# Patient Record
Sex: Male | Born: 1935 | Race: White | Hispanic: No | Marital: Married | Smoking: Former smoker
Health system: Southern US, Community
[De-identification: ages and names within clinical notes are randomized; demographics above are authoritative.]

## PROBLEM LIST (undated history)

## (undated) DIAGNOSIS — M1990 Unspecified osteoarthritis, unspecified site: Secondary | ICD-10-CM

## (undated) DIAGNOSIS — C801 Malignant (primary) neoplasm, unspecified: Secondary | ICD-10-CM

## (undated) DIAGNOSIS — I219 Acute myocardial infarction, unspecified: Secondary | ICD-10-CM

## (undated) DIAGNOSIS — K219 Gastro-esophageal reflux disease without esophagitis: Secondary | ICD-10-CM

## (undated) DIAGNOSIS — I10 Essential (primary) hypertension: Secondary | ICD-10-CM

## (undated) DIAGNOSIS — G709 Myoneural disorder, unspecified: Secondary | ICD-10-CM

## (undated) DIAGNOSIS — E785 Hyperlipidemia, unspecified: Secondary | ICD-10-CM

## (undated) HISTORY — PX: PROSTATE SURGERY: SHX751

## (undated) HISTORY — PX: JOINT REPLACEMENT: SHX530

## (undated) HISTORY — PX: ROTATOR CUFF REPAIR: SHX139

## (undated) HISTORY — PX: BACK SURGERY: SHX140

## (undated) HISTORY — PX: CERVICAL LAMINECTOMY: SHX94

## (undated) HISTORY — PX: SKIN CANCER EXCISION: SHX779

---

## 1998-11-04 ENCOUNTER — Ambulatory Visit (HOSPITAL_COMMUNITY): Admission: RE | Admit: 1998-11-04 | Discharge: 1998-11-04 | Payer: Self-pay | Admitting: Family Medicine

## 1998-11-04 ENCOUNTER — Encounter: Payer: Self-pay | Admitting: Family Medicine

## 1998-12-27 ENCOUNTER — Encounter: Payer: Self-pay | Admitting: Neurological Surgery

## 1998-12-27 ENCOUNTER — Ambulatory Visit (HOSPITAL_COMMUNITY): Admission: RE | Admit: 1998-12-27 | Discharge: 1998-12-27 | Payer: Self-pay | Admitting: Neurological Surgery

## 1999-01-06 ENCOUNTER — Encounter: Payer: Self-pay | Admitting: Neurological Surgery

## 1999-01-10 ENCOUNTER — Encounter: Payer: Self-pay | Admitting: Neurological Surgery

## 1999-01-10 ENCOUNTER — Inpatient Hospital Stay (HOSPITAL_COMMUNITY): Admission: RE | Admit: 1999-01-10 | Discharge: 1999-01-11 | Payer: Self-pay | Admitting: Neurological Surgery

## 2001-02-11 ENCOUNTER — Inpatient Hospital Stay (HOSPITAL_COMMUNITY): Admission: EM | Admit: 2001-02-11 | Discharge: 2001-02-13 | Payer: Self-pay | Admitting: Emergency Medicine

## 2001-02-11 ENCOUNTER — Encounter: Payer: Self-pay | Admitting: Emergency Medicine

## 2001-02-12 ENCOUNTER — Encounter: Payer: Self-pay | Admitting: Cardiovascular Disease

## 2001-08-07 ENCOUNTER — Encounter: Payer: Self-pay | Admitting: Neurological Surgery

## 2001-08-07 ENCOUNTER — Ambulatory Visit (HOSPITAL_COMMUNITY): Admission: RE | Admit: 2001-08-07 | Discharge: 2001-08-07 | Payer: Self-pay | Admitting: Neurological Surgery

## 2003-09-12 ENCOUNTER — Ambulatory Visit (HOSPITAL_COMMUNITY): Admission: RE | Admit: 2003-09-12 | Discharge: 2003-09-12 | Payer: Self-pay | Admitting: Family Medicine

## 2003-09-12 ENCOUNTER — Encounter: Payer: Self-pay | Admitting: Family Medicine

## 2003-11-22 ENCOUNTER — Ambulatory Visit (HOSPITAL_COMMUNITY): Admission: RE | Admit: 2003-11-22 | Discharge: 2003-11-22 | Payer: Self-pay | Admitting: Neurological Surgery

## 2006-02-27 ENCOUNTER — Inpatient Hospital Stay (HOSPITAL_COMMUNITY): Admission: RE | Admit: 2006-02-27 | Discharge: 2006-03-05 | Payer: Self-pay | Admitting: Orthopaedic Surgery

## 2006-06-07 ENCOUNTER — Encounter: Admission: RE | Admit: 2006-06-07 | Discharge: 2006-06-07 | Payer: Self-pay | Admitting: Family Medicine

## 2006-06-14 ENCOUNTER — Encounter: Admission: RE | Admit: 2006-06-14 | Discharge: 2006-06-14 | Payer: Self-pay | Admitting: General Surgery

## 2006-07-01 ENCOUNTER — Ambulatory Visit (HOSPITAL_COMMUNITY): Admission: RE | Admit: 2006-07-01 | Discharge: 2006-07-02 | Payer: Self-pay | Admitting: General Surgery

## 2006-07-01 ENCOUNTER — Encounter (INDEPENDENT_AMBULATORY_CARE_PROVIDER_SITE_OTHER): Payer: Self-pay | Admitting: *Deleted

## 2008-09-22 ENCOUNTER — Encounter: Admission: RE | Admit: 2008-09-22 | Discharge: 2008-09-22 | Payer: Self-pay | Admitting: Orthopaedic Surgery

## 2008-10-01 ENCOUNTER — Inpatient Hospital Stay (HOSPITAL_COMMUNITY): Admission: RE | Admit: 2008-10-01 | Discharge: 2008-10-02 | Payer: Self-pay | Admitting: Orthopaedic Surgery

## 2009-03-24 ENCOUNTER — Encounter: Admission: RE | Admit: 2009-03-24 | Discharge: 2009-03-24 | Payer: Self-pay | Admitting: Orthopaedic Surgery

## 2011-04-10 NOTE — Op Note (Signed)
NAMEFREEMON, BINFORD NO.:  [id number]   MEDICAL RECORD NO.:  [id number]          PATIENT TYPE:  INP   LOCATION:  5019                         FACILITY:  [facility]   PHYSICIAN:  Mark C. Ophelia Charter, M.D.    DATE OF BIRTH:  Apr 09, 1934   DATE OF PROCEDURE:  10/01/2008  DATE OF DISCHARGE:                               OPERATIVE REPORT   PREOPERATIVE DIAGNOSIS:  L5-S1 spinal stenosis.   POSTOPERATIVE DIAGNOSIS:  L5-S1 spinal stenosis.   PROCEDURE:  Left L5-S1 microdiskectomy and foraminotomy.   SURGEON:  Mark C. Ophelia Charter, MD   ANESTHESIA:  GOT.  Marcaine 7 mL local.   ESTIMATED BLOOD LOSS:  Minimal.   FINDINGS:  HNP, extruded left L5-S1 with compression.   After induction of general anesthesia, the patient was placed prone on  chest rolls.  He had total-knee arthroplasty in the past and back was  prepped with DuraPrep.  Preoperative antibiotics were given, Ancef.  The  area squared with towels, Betadine bio drape applied.  Needle was placed  for localization of the L5-S1 interspace.  Cross-table lateral x-ray was  used to confirm it.  While this was developing, the time-out checklist  procedure was completed.   An incision was made and midline, subperiosteal dissection was performed  on the left side.  Foraminotomy was performed with laminotomy.  There  was overhanging spurs, which returned back and there was disk herniation  on the left at L5-S1 with extruded fragment that was outside the  interspace and was lateral and superior corresponding to the MRI scan.  This was causing a compression, and chunks of the disks were teased and  removed.  A periodic stick was passed around and some remaining  fragments were found.  Epstein's were used in the midline and diskectomy  was performed.  Foramina was enlarged, nerve root was freed.  After  irrigation of saline solution with findings of disk herniation,  consistent with the patient's leg symptoms that he had been having,  the  opposite foramina was not exposed.  Disk fragment was moderately large.  After irrigation with saline solution with the patient's new onset of  left leg symptoms was felt that the opposite foramina was minimally  type, but the indications for surgery had been sudden onset of  significant left leg pain with disk herniation present for 5 level on  the opposite foramina on the right side were left.  Hockey stick was  passed into the dura with no areas of compression.  Layered  closure with 0 Vicryl and the deep fascia with 2-0, and the subcutaneous  tissue with 4-0 Vicryl subcuticular closure.  Instrument count and  needle count was correct.  After Steri-Strips, and Marcaine  infiltration, postop dressing, the patient was placed supine, extubated  and transferred to recovery room in stable condition.      Mark C. Ophelia Charter, M.D.  Electronically Signed     MCY/MEDQ  D:  10/01/2008  T:  10/01/2008  Job:  [id number]

## 2011-04-13 NOTE — Discharge Summary (Signed)
[name], [name] NO.:  [id number]   MEDICAL RECORD NO.:  [id number]          PATIENT TYPE:  INP   LOCATION:  5004                         FACILITY:  [facility]   PHYSICIAN:  [name], M.D.    DATE OF BIRTH:  1934/02/27   DATE OF ADMISSION:  02/27/2006  DATE OF DISCHARGE:  03/05/2006                                 DISCHARGE SUMMARY   FINAL DIAGNOSIS:  Right knee osteoarthritis, right total knee arthroplasty  performed.   ADDITIONAL DIAGNOSES:  1.  Bladder neck obstruction.  2.  Postoperative urinary retention.  3.  Hypertension.  4.  Diabetes type 2.  5.  Old myocardial infarction.  6.  Esophageal reflux.  7.  Right bundle branch block.  8.  Coronary artery disease.   ADMISSION MEDICATIONS:  Accupril, potassium, Avandia, Lasix, Glucotrol,  oxycodone, diltiazem, Protonix, Neurontin, clonazepam, aspirin and Xanax.  See H&P for dosages.   This is a 77 year old male who has progressive right knee osteoarthritis,  failed conservative treatment, tricompartmental degenerative changes with  evidence of meniscal tearing and bone-on-bone changes on x-ray.  Preoperative labs included normal CBC with hemoglobin of 13.9.  PT/PTT were  normal.  Chemistry panel was normal.  Urinalysis was normal.   PROCEDURE:  After induction of general anesthesia, the patient was planned  to have a total knee arthroplasty, however Foley catheter was unable to be  inserted.  Urology consultation was obtained on an emergent basis with Dr.  Aldean [name], and he recommended proceeding, and he would insert the Foley  postoperatively, prior to patient being extubated and transferred to the  recovery room.  Right cemented total knee arthroplasty was performed.  Tourniquet time was 1 hour and 2 minutes.  Foley was inserted by Dr.  Aldean [name] and was recommended to be left in and coude' tip 16-Foley was left  in.  Office follow up with Dr. Aldean [name] was made.  Hemoglobin was 11.5  postop.   Glucose was 147.  Pain was well controlled.  He was slow with  physical therapy.  He was converted from morphine pump to oral pain  medication.  He could not straight leg raise by March 02, 2006, postop day  three, continued CMP and diltiazem was adjusted for his blood pressure.  He  continued his therapy over the weekend, and though he was felt to be an  ideal candidate for SACU, this bed was not available.  He continued therapy  until he was ambulating safely enough to go home.  Nursing home was  discussed and considered, but with gradual progression he was finally safely  ambulatory and was discharged home in satisfactory condition.   ASSESSMENT:  1.  Bladder outlet obstruction with intraoperative Foley insertion by Dr.      Vic [name].  2.  Knee osteoarthritis status post total knee arthroplasty.   FOLLOW UP:  1.  Dr. Aldean [name] in 2-3 weeks.  2.  Dr. Ophelia [name] in 2 weeks.   CONDITION AT DISCHARGE:  Improved.      [name], M.D.  Electronically Signed     MCY/MEDQ  D:  03/31/2006  T:  04/01/2006  Job:  [id number]

## 2011-04-13 NOTE — Discharge Summary (Signed)
[place]  Patient:    [name], [name]                    MRN: [id number] Adm. Date:  [id number] Disc. Date: [id number] Attending:  Ruta Hinds CC:         Talmadge Coventry, M.D.   Discharge Summary  ADMISSION DIAGNOSES: 1. Unstable angina, rule out myocardial infarction. 2. Hypertension. 3. Adult-onset diabetes mellitus. 4. Exogenous obesity.  MEDICATIONS: 1. Avandia 4 mg q.d. 2. Furosemide 40 mg b.i.d. 3. Neurontin 300 mg q.d. 4. Glucotrol XL 10 mg q.d. 5. Diltiazem 240 mg q.d. 6. Accuretic 20/12 mg q.d.  HISTORY OF PRESENT ILLNESS:  [name] is a 77 year old Caucasian male, father of four and grandfather of six children, reported to the emergency room with severe chest pressure and tightness.  He reported waking up at 3 oclock at night with severe substernal chest pressure.  He did not take any nitroglycerin because the old refill expired.  He said he had a similar episode of pain experienced two weeks ago and, at that time, he had to lie down and rest, and the pain went away; but this time the pain was much worse and lasted longer.  PAST MEDICAL HISTORY: 1. Transurethral resection of the prostate secondary to benign prostatic    hypertrophy. 2. Anterior cervical diskectomy and arthrodesis at C5-6 and C6-7. 3. Hypertension. 4. Adult-onset diabetes mellitus. 5. History of cardiac catheterization performed seven or eight years ago by    Dr. Elsie Lincoln and, at that time, the study was negative.  FAMILY HISTORY:  Positive for cardiovascular disease.  Mother had hypertension and a massive heart attack and stroke.  His sister also has a history of coronary vascular disease.  ALLERGIES:  No known drug allergies.  HOSPITAL COURSE:  The patient was admitted to the telemetry unit and started on heparin drip and nitroglycerin drip, and he reported that the chest pain and pressure became better.  By a few hours later  he reported increased chest pain and was given a dose of morphine sulfate, which relieved the pain and eased the breathing.  Laboratories on admission were white blood count 7.5, hemoglobin 13.3, hematocrit 39, platelets 215.  EKG showed normal sinus rhythm with ST changes in inferior leads.  CK was 313, CK-MB 5, troponin 0.02.  The patient underwent coronary angiography performed by Dr. Tresa Endo.  It showed mild coronary artery disease, 10% narrowing of right carotid artery, and 20% narrowing of LAD.  It did not require any intervention.  The patient tolerated the procedure well.  There was no bruising and bleeding.  DISCHARGE DIAGNOSES: 1. Mild coronary artery disease, status post cardiac catheterization on February 12, 2001.  Requires medical treatment and outpatient follow-up. 2. Hypertension, which requires better control, and the patients    medications were changed.  Instead of Accuretic, he was started on    Accupril 20 mg q.d. 3. Adult-onset diabetes mellitus, in control. 4. Obesity.  DISCHARGE PHYSICAL EXAMINATION:  VITAL SIGNS:  Temperature 97.7, blood pressure 154/75, pulse 82, respirations 20.  HEART:  Regular rate and rhythm.  LUNGS:  Clear to auscultation bilaterally.  ABDOMEN:  Obese.  Bowel sounds present x 4.  Soft, nontender, nondistended.  EXTREMITIES:  With 1+ pedal pulses and 1+ edema.  Right groin did not show any signs of bleeding or bruising.  LABORATORY DATA:  His lipid fasting profile was normal at the time of discharge  with cholesterol 97, triglycerides 59, HDL 37, and LDL 46.  BMET at the time of discharge showed sodium 138 and low potassium at 3.2, bicarbonate 29, chloride 104, BUN 9, creatinine 0.7, with glucose 118.  Hypokalemia was treated with potassium supplements.  The patient was given K-Dur 20 mEq and was discharged home with prescription for potassium also.  DISCHARGE MEDICATIONS: 1. K-Dur 20 mEq 1 pill daily. 2. Accupril 20 mg 1 pill  daily. 3. Protonix 40 mg 1 pill daily. 4. Avandia 4 mg 1 pill daily. 5. Glucotrol XL 10 mg 1 pill daily. 6. Diltiazem 240 mg 1 pill daily. 7. Lasix 40 mg 1 pill twice a day.  He was given prescriptions for new medications of Accupril, Protonix, and K-Dur.  DISCHARGE INSTRUCTIONS:  He was instructed to report to the laboratory to check BMP on Monday to make sure that his hypokalemia is treated.  FOLLOW-UP:  Follow-up appointment was scheduled with Dr. Elsie Lincoln ______ to follow up at 1:20 p.m. DD:  02/13/01 TD:  02/14/01 Job: [id number]

## 2011-04-13 NOTE — Op Note (Signed)
NAMECRAY, MONNIN NO.:  [id number]   MEDICAL RECORD NO.:  [id number]          PATIENT TYPE:  INP   LOCATION:  2899                         FACILITY:  [facility]   PHYSICIAN:  Mark C. Ophelia Charter, M.D.    DATE OF BIRTH:  26-Apr-1934   DATE OF PROCEDURE:  DATE OF DISCHARGE:  02/27/2006                                 OPERATIVE REPORT   PREOPERATIVE DIAGNOSIS:  Right knee osteoarthritis.   POSTOPERATIVE DIAGNOSIS:  Right knee osteoarthritis.   OPERATION/PROCEDURE:  Right total knee arthroplasty.   SURGEON:  Mark C. Ophelia Charter, M.D.   ANESTHESIA:  GOT plus Marcaine local.   TOURNIQUET TIME:  One hour 10 minutes.   DESCRIPTION OF PROCEDURE:  After induction of general anesthesia orotracheal  intubation, attempted Foley placement by the nurse and also myself was  unsuccessful.  Urology service was contacted, Dr. Aldean Ast, and he  recommended we proceed with the surgery.  He would come in and insert the  Foley at the end of the case.  The standard prep and drape was performed,  sterile skin marker,__________  stockinette, Coban, and Vi-Drape.  The leg  was wrapped in Esmarch, tourniquet inflated.  A midline incision was made.  Superficial retinaculum was developed.  Medial parapatellar skin incision  was used tobring the __________ between the medial one-third and lateral two-  thirds.  Patella was flipped over.  Patella was cut first followed by taking  10 mm off the distal femur.  Next, intramedullary cut was made on the tibia  removing the 8-10 mm of bone.  Sequential chamfer cuts for #4 size on the  femur, #4 on the tibia were performed and a 38 mm patella.  Box cuts were  made. Posterior osteophytes were removed off the femur.  Cruciate PCL  remnant was removed with trials and the knee would come up to full  extension, flexes easily.  There was good symmetrical balance medial and  lateral.  After pulsatile irrigation, cement was mixed in the vacuum,  implanted tibia  first followed by the femur, then patella.  Tibial insert  was popped in which was a 10 mm based on trial sizes.  All excessive cement  had been trimmed away.  Cement was hard at 17 minutes.  Tourniquet was  deflated after an hour and 10 minutes.  Hemostasis was obtained.  Deep layer  closed with 0 Ethibond and 0 Ticron, 2-0 Vicryl in the superficial  retinaculum, subcutaneous tissue and skin staple closure.  Marcaine  infiltration, 10 mL, followed by urologic service with the Foley catheter  placement.  Afterwards depending Foley placement, the patient may have a  femoral block by the anesthesia service for postoperative analgesia.      Mark C. Ophelia Charter, M.D.  Electronically Signed     MCY/MEDQ  D:  02/27/2006  T:  02/28/2006  Job:  [id number]

## 2011-04-13 NOTE — Op Note (Signed)
NAMEMARSHUN, DUVA NO.:  [id number]   MEDICAL RECORD NO.:  [id number]          PATIENT TYPE:  INP   LOCATION:  2899                         FACILITY:  [facility]   PHYSICIAN:  Courtney Paris, M.D.DATE OF BIRTH:  05/21/1934   DATE OF PROCEDURE:  02/27/2006  DATE OF DISCHARGE:                                 OPERATIVE REPORT   CONSULTATION AND OPERATIVE NOTE:  Consult requested by Veverly Fells. Ophelia Charter, M.D.   PREOPERATIVE DIAGNOSIS:  Unable to pass Foley catheter.   POSTOPERATIVE DIAGNOSIS:  Bladder neck contracture.   PROCEDURE:  Cystoscopy, bladder neck dilation, insertion of Foley catheter.   BRIEF HISTORY:  The patient is a 77 year old patient who was undergoing a  right knee arthroplasty by Dr. Ophelia Charter but preoperatively after he was asleep  was unable to insert Foley catheter.  Urologic consultation was then  obtained.  The patient was asleep so I was unable to obtain history, but  apparently in the notes he has had some prostate problems in the past.  I do  not know if he has had previous surgery.   The right knee had already been done by Dr. Ophelia Charter, was wrapped and dressed  and covered with a waterproof sheet.  The patient was then prepped and  draped and using the flexible cystoscope, his anterior urethra was  inspected.  No anterior strictures were seen.  The posterior urethra showed  what looked like a TUR effect with a very small opening into the bladder  neck.  This was probably 2 or 3 [place] in size.  Through the scope I was  able to pass a guidewire under direct vision through the scope, then tried  to pass a #16 Council-tip catheter, but did not seem to want to go.  Leaving  the wire in place, it was again checked to make sure that it was still in  position and a #20 and 24 Council-tip ureteral sound was then passed over  the guidewire into the bladder without difficulty.  Following this I was  able to get the #16 Foley catheter into the bladder, which  was then drained  with clear urine.  This was left to straight drainage.   The plan is to leave the catheter probably for at least two to three weeks  to soft catheter dilate the bladder neck contracture.  He may need a more  formal incision of the bladder neck at a later time but due to his recent  knee surgery, hopefully this will be put off until a later time.  I will  give his family one of my cards and I will have him come to my office in  three weeks for follow-up and management of the Foley, which will probably  be removed at that time.      Courtney Paris, M.D.  Electronically Signed     HMK/MEDQ  D:  02/27/2006  T:  02/28/2006  Job:  [id number]

## 2011-04-13 NOTE — Cardiovascular Report (Signed)
[place]  Patient:    [name], [name]                      MRN: [id number] Proc. Date: 02/12/01 Attending:  Lennette Bihari, M.D. CC:         Madaline Savage, M.D.  Talmadge Coventry, M.D.  Marnee Spring   Cardiac Catheterization  INDICATIONS:  [name] is a 77year-old gentleman, with a history of hypertension for many years, type 2 diabetes mellitus, and remote normal coronary arteries by catheterization in 1994.  The patient was admitted on February 11, 2001, with chest pain suggesting the possibility of unstable angina. CK was elevated at 313 with an MB of 5.0.  The patient is now referred for definitive diagnostic cardiac catheterization.  PROCEDURES:  Left heart catheterization; cine coronary angiography; biplane cine left ventriculography; distal aortography.  HEMODYNAMIC DATA:  Central aortic pressure 137/68.  Left ventricular pressure 137/21.  ANGIOGRAPHIC DATA:  Left main coronary artery:  The left main coronary artery was a very long vessel that was angiographically normal and trifurcated into an LAD, a ramus intermediate vessel and left circumflex coronary artery.  Left anterior descending:  The LAD extended to the apex and gave rise to several proximal septal perforating arteries and several diagonal vessels. There was smooth diffuse 20% narrowing between a first and second septal perforator in the region of the first diagonal takeoff.  The ramus intermediate vessel was angiographically normal.  Circumflex artery:  The circumflex gave rise to one additional marginal vessel and was angiographically normal.  Right coronary artery:  The right coronary artery was a very large dominant vessel that had mild 20% mid and 20% distal minimal irregularity.  The vessel supplied a PDA, ______ left ventricular branches and ended in a very large posterolateral vessel which extended to the apex.  Biplane cinearteriography  revealed normal LV function with an ejection fraction of 69%.  There was a suggestion of very mild angiographic mitral valve prolapse.  IMPRESSION: 1. Normal left ventricular function. 2. Mild angiographic mitral valve prolapse. 3. Smooth 20% diffuse proximal left anterior descending stenosis. 4. A 20% mid and 20% distal mild luminal irregularity in a large dominant    right coronary artery.  RECOMMENDATIONS:  Medical therapy. DD:  02/12/01 TD:  02/13/01 Job: [id number]

## 2011-04-13 NOTE — Op Note (Signed)
NAMEBENTLEIGH, STANKUS NO.:  [id number]   MEDICAL RECORD NO.:  [id number]          PATIENT TYPE:  OIB   LOCATION:  0098                         [facility]ACILITY[facility]  [facility]   PHYSICIAN:  Gita Kudo, M.D. DATE OF BIRTH:  [birth year]   DATE OF PROCEDURE:  07/01/2006  DATE OF DISCHARGE:                                 OPERATIVE REPORT   OPERATIVE PROCEDURE:  Laparoscopic cholecystectomy with intraoperative  cholangiogram.   SURGEON:  Gita Kudo, M.D.   ASSISTANT:  Alfonse Ras, MD   ANESTHESIA:  General.   PREOPERATIVE DIAGNOSIS:  Cholecystitis, stones.   POSTOPERATIVE DIAGNOSIS:  Cholecystitis, stones.   CLINICAL SUMMARY:  A 77 year old male brought in for elective  cholecystectomy because of abdominal pain and fatty food intolerance.   OPERATIVE FINDINGS:  The patient's gallbladder was distended, thin-walled.  It was intrahepatic.  I had leave the back wall of the most distal portion.   OPERATIVE PROCEDURE:  Under satisfactory general endotracheal anesthesia,  having received 1.0 grams Ancef preop, the patient was positioned, prepped  and draped in standard fashion.  A total of 30 mL of 0.5% Marcaine with  epinephrine was infiltrated at the skin incision sites for postop analgesia.  Transverse incision made above the umbilicus, midline opened into the  peritoneum and controlled with a figure-of-eight 0 Vicryl suture.  Then  camera placed after CO2 pneumoperitoneum established.  Under direct vision  two #5 ports were placed laterally and a second #10 medially.  Lateral  graspers gave good exposure and operating through the medial port with good  visualization, I dissected the cystic duct and artery.  A single clip placed  on the cystic duct near the gallbladder, incision made and percutaneous  catheter placed.  Good cholangiogram obtained without obstruction or defect.  Catheter withdrawn and the duct controlled with multiple clips and divided.  Likewise the artery was controlled with multiple clips and divided.  The  gallbladder then removed from below upward using coagulating current for  hemostasis and dissection.   At the most proximal portion of the gallbladder, it was quite adherent to  the liver and I felt best to enter it and leave the posterior wall which was  done.  After the gallbladder was removed, it was placed in an EndoCatch bag  and the entire liver bed cauterized.  There was a little bit of oozing and  this was controlled with FloSeal and Surgicel.  The gallbladder was then  removed through the umbilical port after the camera was moved proximally.  The abdomen was lavaged with saline and suctioned dry.  CO2 and ports  removed.   Midline closed with a previous figure-of-eight and a second 0 Vicryl suture.  Subcu approximated with 4-0 Vicryl and Steri-Strips for skin.  Sterile  absorbent dressings were then applied and the patient went to the recovery  room from the operating room in good condition without complication.           ______________________________  Gita Kudo, M.D.     MRL/MEDQ  D:  07/01/2006  T:  07/01/2006  Job:  [id number]  cc:   Loraine Leriche C. Ophelia Charter, M.D.  Fax: [phone]   Courtney Paris, M.D.  Fax: [phone]   Talmadge Coventry, M.D.  Fax: [phone]

## 2011-07-20 ENCOUNTER — Ambulatory Visit: Payer: Self-pay | Admitting: Otolaryngology

## 2011-08-02 ENCOUNTER — Ambulatory Visit: Payer: Self-pay | Admitting: Otolaryngology

## 2011-08-28 LAB — CBC
HCT: 43.5
Hemoglobin: 14.5
MCHC: 33.3
MCV: 90
Platelets: 189
RBC: 4.83
RDW: 14.4
WBC: 8.4

## 2011-08-28 LAB — PROTIME-INR
INR: 1
Prothrombin Time: 13.9

## 2011-08-28 LAB — COMPREHENSIVE METABOLIC PANEL
ALT: 15
AST: 18
Albumin: 3.7
Alkaline Phosphatase: 55
BUN: 13
CO2: 27
Calcium: 8.8
Chloride: 106
Creatinine, Ser: 0.87
GFR calc Af Amer: >60
GFR calc non Af Amer: >60
Glucose, Bld: 192 — ABNORMAL HIGH
Potassium: 3.9
Sodium: 139
Total Bilirubin: 1.2
Total Protein: 5.9 — ABNORMAL LOW

## 2011-08-28 LAB — DIFFERENTIAL
Basophils Absolute: 0
Basophils Relative: 1
Eosinophils Absolute: 0.1
Eosinophils Relative: 1
Lymphocytes Relative: 23
Lymphs Abs: 1.9
Monocytes Absolute: 0.6
Monocytes Relative: 7
Neutro Abs: 5.8
Neutrophils Relative %: 69

## 2011-08-28 LAB — GLUCOSE, CAPILLARY
Glucose-Capillary: 109 — ABNORMAL HIGH
Glucose-Capillary: 138 — ABNORMAL HIGH
Glucose-Capillary: 140 — ABNORMAL HIGH
Glucose-Capillary: 147 — ABNORMAL HIGH

## 2011-08-28 LAB — HEMOGLOBIN A1C
Hgb A1c MFr Bld: 6.1
Mean Plasma Glucose: 128

## 2011-08-28 LAB — APTT: aPTT: 28

## 2012-08-26 ENCOUNTER — Encounter (HOSPITAL_COMMUNITY): Payer: Self-pay | Admitting: Pharmacist

## 2012-08-29 ENCOUNTER — Other Ambulatory Visit: Payer: Self-pay | Admitting: Orthopedic Surgery

## 2012-09-01 ENCOUNTER — Encounter (HOSPITAL_COMMUNITY)
Admission: RE | Admit: 2012-09-01 | Discharge: 2012-09-01 | Disposition: A | Discharge status: Home / Self Care | Payer: Medicare Other | Source: Ambulatory Visit | Attending: Orthopedic Surgery | Admitting: Orthopedic Surgery

## 2012-09-01 ENCOUNTER — Encounter (HOSPITAL_COMMUNITY): Payer: Self-pay

## 2012-09-01 HISTORY — DX: Gastro-esophageal reflux disease without esophagitis: K21.9

## 2012-09-01 HISTORY — DX: Unspecified osteoarthritis, unspecified site: M19.90

## 2012-09-01 HISTORY — DX: Acute myocardial infarction, unspecified: I21.9

## 2012-09-01 HISTORY — DX: Hyperlipidemia, unspecified: E78.5

## 2012-09-01 HISTORY — DX: Myoneural disorder, unspecified: G70.9

## 2012-09-01 HISTORY — DX: Essential (primary) hypertension: I10

## 2012-09-01 HISTORY — DX: Malignant (primary) neoplasm, unspecified: C80.1

## 2012-09-01 LAB — COMPREHENSIVE METABOLIC PANEL
ALT: 24 U/L (ref 0–53)
AST: 23 U/L (ref 0–37)
Alkaline Phosphatase: 64 U/L (ref 39–117)
CO2: 30 mEq/L (ref 19–32)
Calcium: 9.1 mg/dL (ref 8.4–10.5)
Chloride: 98 mEq/L (ref 96–112)
GFR calc Af Amer: >90 mL/min (ref >90)
GFR calc non Af Amer: 86 mL/min — ABNORMAL LOW (ref >90)
Glucose, Bld: 207 mg/dL — ABNORMAL HIGH (ref 70–99)
Potassium: 3.4 mEq/L — ABNORMAL LOW (ref 3.5–5.1)
Sodium: 138 mEq/L (ref 135–145)
Total Bilirubin: 0.7 mg/dL (ref 0.3–1.2)

## 2012-09-01 LAB — SURGICAL PCR SCREEN
MRSA, PCR: NEGATIVE
Staphylococcus aureus: NEGATIVE

## 2012-09-01 LAB — URINALYSIS, ROUTINE W REFLEX MICROSCOPIC
Bilirubin Urine: NEGATIVE
Glucose, UA: NEGATIVE mg/dL
Hgb urine dipstick: NEGATIVE
Ketones, ur: NEGATIVE mg/dL
Leukocytes, UA: NEGATIVE
Protein, ur: NEGATIVE mg/dL
pH: 6.5 (ref 5.0–8.0)

## 2012-09-01 LAB — TYPE AND SCREEN

## 2012-09-01 LAB — CBC
Hemoglobin: 15.6 g/dL (ref 13.0–17.0)
MCH: 29.4 pg (ref 26.0–34.0)
Platelets: 196 10*3/uL (ref 150–400)
RBC: 5.31 MIL/uL (ref 4.22–5.81)
WBC: 12 10*3/uL — ABNORMAL HIGH (ref 4.0–10.5)

## 2012-09-01 LAB — ABO/RH: ABO/RH(D): A POS

## 2012-09-01 NOTE — Pre-Procedure Instructions (Signed)
22 [name]  09/01/2012   Your procedure is scheduled on:  09-08-2012  Report to [facility] at 5:30 AM.  Call this number if you have problems the morning of surgery: [phone]   Remember:   Do not eat food or drink:After Midnight.      Take these medicines the morning of surgery with A SIP OF WATER: Metoprolol(Toprol XL)omeprazole(Prilosec),pain medication as needed,   Do not wear jewelry Do not wear lotions, powders, or perfumes. You may wear deodorant.  Do not shave 48 hours prior to surgery. Men may shave face and neck.  Do not bring valuables to the hospital.  Contacts, dentures or bridgework may not be worn into surgery.  Leave suitcase in the car. After surgery it may be brought to your room.   For patients admitted to the hospital, checkout time is 11:00 AM the day of discharge.   Marland Kitchen  Special Instructions: Incentive Spirometry - Practice and bring it with you on the day of surgery. Shower using CHG 2 nights before surgery and the night before surgery.  If you shower the day of surgery use CHG.  Use special wash - you have one bottle of CHG for all showers.  You should use approximately 1/3 of the bottle for each shower.   Please read over the following fact sheets that you were given: Pain Booklet, Coughing and Deep Breathing, Blood Transfusion Information, MRSA Information and Surgical Site Infection Prevention

## 2012-09-01 NOTE — Progress Notes (Signed)
EKG,ECHO,StressTest and OV requested from [facility],Dr. Tresa Endo.

## 2012-09-02 LAB — URINE CULTURE: Colony Count: 9000

## 2012-09-07 MED ORDER — CEFAZOLIN SODIUM-DEXTROSE 2-3 GM-% IV SOLR
2.0000 g | INTRAVENOUS | Status: AC
  Administered 2012-09-08: 2 g via INTRAVENOUS
  Filled 2012-09-07 (×2): qty 50

## 2012-09-08 ENCOUNTER — Inpatient Hospital Stay (HOSPITAL_COMMUNITY): Payer: Medicare Other | Admitting: Certified Registered"

## 2012-09-08 ENCOUNTER — Encounter (HOSPITAL_COMMUNITY): Payer: Self-pay | Admitting: Certified Registered"

## 2012-09-08 ENCOUNTER — Inpatient Hospital Stay (HOSPITAL_COMMUNITY)
Admission: RE | Admit: 2012-09-08 | Discharge: 2012-09-10 | DRG: 470 | Disposition: A | Discharge status: Home Health | Payer: Medicare Other | Source: Ambulatory Visit | Attending: Orthopedic Surgery | Admitting: Orthopedic Surgery

## 2012-09-08 ENCOUNTER — Encounter (HOSPITAL_COMMUNITY)
Admission: RE | Disposition: A | Discharge status: Home Health | Payer: Self-pay | Source: Ambulatory Visit | Attending: Orthopedic Surgery

## 2012-09-08 ENCOUNTER — Encounter (HOSPITAL_COMMUNITY): Payer: Self-pay | Admitting: *Deleted

## 2012-09-08 DIAGNOSIS — Z87891 Personal history of nicotine dependence: Secondary | ICD-10-CM

## 2012-09-08 DIAGNOSIS — E119 Type 2 diabetes mellitus without complications: Secondary | ICD-10-CM | POA: Diagnosis present

## 2012-09-08 DIAGNOSIS — E785 Hyperlipidemia, unspecified: Secondary | ICD-10-CM | POA: Diagnosis present

## 2012-09-08 DIAGNOSIS — Z85828 Personal history of other malignant neoplasm of skin: Secondary | ICD-10-CM

## 2012-09-08 DIAGNOSIS — M1710 Unilateral primary osteoarthritis, unspecified knee: Principal | ICD-10-CM | POA: Diagnosis present

## 2012-09-08 DIAGNOSIS — I10 Essential (primary) hypertension: Secondary | ICD-10-CM | POA: Diagnosis present

## 2012-09-08 DIAGNOSIS — Z79899 Other long term (current) drug therapy: Secondary | ICD-10-CM

## 2012-09-08 DIAGNOSIS — M1712 Unilateral primary osteoarthritis, left knee: Secondary | ICD-10-CM

## 2012-09-08 DIAGNOSIS — D62 Acute posthemorrhagic anemia: Secondary | ICD-10-CM | POA: Diagnosis not present

## 2012-09-08 DIAGNOSIS — Z7901 Long term (current) use of anticoagulants: Secondary | ICD-10-CM

## 2012-09-08 DIAGNOSIS — Z96659 Presence of unspecified artificial knee joint: Secondary | ICD-10-CM

## 2012-09-08 DIAGNOSIS — I252 Old myocardial infarction: Secondary | ICD-10-CM

## 2012-09-08 DIAGNOSIS — K219 Gastro-esophageal reflux disease without esophagitis: Secondary | ICD-10-CM | POA: Diagnosis present

## 2012-09-08 HISTORY — PX: TOTAL KNEE ARTHROPLASTY: SHX125

## 2012-09-08 LAB — HEMOGLOBIN A1C: Hgb A1c MFr Bld: 6.2 % — ABNORMAL HIGH (ref <5.7)

## 2012-09-08 LAB — GLUCOSE, CAPILLARY
Glucose-Capillary: 107 mg/dL — ABNORMAL HIGH (ref 70–99)
Glucose-Capillary: 127 mg/dL — ABNORMAL HIGH (ref 70–99)

## 2012-09-08 SURGERY — ARTHROPLASTY, KNEE, TOTAL
Anesthesia: Regional | Site: Knee | Laterality: Left | Wound class: Clean

## 2012-09-08 MED ORDER — METHOCARBAMOL 100 MG/ML IJ SOLN
500.0000 mg | Freq: Once | INTRAVENOUS | Status: AC
  Administered 2012-09-08: 500 mg via INTRAVENOUS
  Filled 2012-09-08: qty 5

## 2012-09-08 MED ORDER — SODIUM CHLORIDE 0.9 % IV SOLN: INTRAVENOUS | Status: DC

## 2012-09-08 MED ORDER — GLIPIZIDE ER 5 MG PO TB24
5.0000 mg | ORAL_TABLET | Freq: Every day | ORAL | Status: DC
  Administered 2012-09-09 – 2012-09-10 (×2): 5 mg via ORAL
  Filled 2012-09-08 (×3): qty 1

## 2012-09-08 MED ORDER — FENTANYL CITRATE 0.05 MG/ML IJ SOLN
INTRAMUSCULAR | Status: DC | PRN
  Administered 2012-09-08: 50 ug via INTRAVENOUS
  Administered 2012-09-08: 75 ug via INTRAVENOUS
  Administered 2012-09-08: 25 ug via INTRAVENOUS
  Administered 2012-09-08: 50 ug via INTRAVENOUS

## 2012-09-08 MED ORDER — ACETAMINOPHEN 325 MG PO TABS: 650.0000 mg | ORAL_TABLET | Freq: Four times a day (QID) | ORAL | Status: DC | PRN

## 2012-09-08 MED ORDER — LIDOCAINE HCL (CARDIAC) 20 MG/ML IV SOLN
INTRAVENOUS | Status: DC | PRN
  Administered 2012-09-08: 70 mg via INTRAVENOUS

## 2012-09-08 MED ORDER — QUINAPRIL HCL 10 MG PO TABS
20.0000 mg | ORAL_TABLET | Freq: Every day | ORAL | Status: DC
  Administered 2012-09-09 – 2012-09-10 (×2): 20 mg via ORAL
  Filled 2012-09-08 (×3): qty 2

## 2012-09-08 MED ORDER — ZOLPIDEM TARTRATE 5 MG PO TABS: 5.0000 mg | ORAL_TABLET | Freq: Every evening | ORAL | Status: DC | PRN

## 2012-09-08 MED ORDER — PROPOFOL 10 MG/ML IV BOLUS
INTRAVENOUS | Status: DC | PRN
  Administered 2012-09-08: 190 mg via INTRAVENOUS

## 2012-09-08 MED ORDER — POTASSIUM CHLORIDE CRYS ER 20 MEQ PO TBCR
20.0000 meq | EXTENDED_RELEASE_TABLET | Freq: Two times a day (BID) | ORAL | Status: DC
  Administered 2012-09-08 – 2012-09-10 (×5): 20 meq via ORAL
  Filled 2012-09-08 (×6): qty 1

## 2012-09-08 MED ORDER — ACETAMINOPHEN 650 MG RE SUPP: 650.0000 mg | Freq: Four times a day (QID) | RECTAL | Status: DC | PRN

## 2012-09-08 MED ORDER — DOCUSATE SODIUM 100 MG PO CAPS
100.0000 mg | ORAL_CAPSULE | Freq: Two times a day (BID) | ORAL | Status: DC
  Administered 2012-09-08 – 2012-09-10 (×5): 100 mg via ORAL
  Filled 2012-09-08 (×8): qty 1

## 2012-09-08 MED ORDER — METHOCARBAMOL 500 MG PO TABS
500.0000 mg | ORAL_TABLET | Freq: Four times a day (QID) | ORAL | Status: DC | PRN
  Administered 2012-09-08: 500 mg via ORAL
  Filled 2012-09-08: qty 1

## 2012-09-08 MED ORDER — METOPROLOL SUCCINATE ER 50 MG PO TB24
50.0000 mg | ORAL_TABLET | Freq: Every day | ORAL | Status: DC
  Administered 2012-09-09 – 2012-09-10 (×2): 50 mg via ORAL
  Filled 2012-09-08 (×3): qty 1

## 2012-09-08 MED ORDER — OXYCODONE HCL 10 MG PO TB12
10.0000 mg | ORAL_TABLET | Freq: Two times a day (BID) | ORAL | Status: DC
  Administered 2012-09-08 – 2012-09-09 (×4): 10 mg via ORAL
  Filled 2012-09-08 (×5): qty 1

## 2012-09-08 MED ORDER — BUPIVACAINE 0.25 % ON-Q PUMP SINGLE CATH 300ML
INJECTION | Status: DC | PRN
  Administered 2012-09-08: 300 mL

## 2012-09-08 MED ORDER — OXYCODONE HCL 5 MG PO TABS: 5.0000 mg | ORAL_TABLET | Freq: Once | ORAL | Status: DC | PRN

## 2012-09-08 MED ORDER — METOCLOPRAMIDE HCL 10 MG PO TABS: 5.0000 mg | ORAL_TABLET | Freq: Three times a day (TID) | ORAL | Status: DC | PRN

## 2012-09-08 MED ORDER — BUPIVACAINE-EPINEPHRINE PF 0.5-1:200000 % IJ SOLN
INTRAMUSCULAR | Status: DC | PRN
  Administered 2012-09-08: 30 mL

## 2012-09-08 MED ORDER — SODIUM CHLORIDE 0.9 % IR SOLN
Status: DC | PRN
  Administered 2012-09-08: 3000 mL

## 2012-09-08 MED ORDER — METOCLOPRAMIDE HCL 5 MG/ML IJ SOLN: 5.0000 mg | Freq: Three times a day (TID) | INTRAMUSCULAR | Status: DC | PRN

## 2012-09-08 MED ORDER — INSULIN ASPART 100 UNIT/ML ~~LOC~~ SOLN
0.0000 [IU] | Freq: Every day | SUBCUTANEOUS | Status: DC
  Administered 2012-09-09: 0 [IU] via SUBCUTANEOUS

## 2012-09-08 MED ORDER — TORSEMIDE 20 MG PO TABS
40.0000 mg | ORAL_TABLET | Freq: Two times a day (BID) | ORAL | Status: DC
  Administered 2012-09-08 – 2012-09-10 (×4): 40 mg via ORAL
  Filled 2012-09-08 (×6): qty 2

## 2012-09-08 MED ORDER — CHLORHEXIDINE GLUCONATE 4 % EX LIQD: 60.0000 mL | Freq: Once | CUTANEOUS | Status: DC

## 2012-09-08 MED ORDER — ACETAMINOPHEN 10 MG/ML IV SOLN
1000.0000 mg | Freq: Four times a day (QID) | INTRAVENOUS | Status: AC
  Administered 2012-09-08 – 2012-09-09 (×4): 1000 mg via INTRAVENOUS
  Filled 2012-09-08 (×4): qty 100

## 2012-09-08 MED ORDER — METHOCARBAMOL 100 MG/ML IJ SOLN
500.0000 mg | Freq: Four times a day (QID) | INTRAVENOUS | Status: DC | PRN
  Filled 2012-09-08: qty 5

## 2012-09-08 MED ORDER — ONDANSETRON HCL 4 MG/2ML IJ SOLN: 4.0000 mg | Freq: Four times a day (QID) | INTRAMUSCULAR | Status: DC | PRN

## 2012-09-08 MED ORDER — OXYCODONE HCL 5 MG/5ML PO SOLN: 5.0000 mg | Freq: Once | ORAL | Status: DC | PRN

## 2012-09-08 MED ORDER — MENTHOL 3 MG MT LOZG: 1.0000 | LOZENGE | OROMUCOSAL | Status: DC | PRN

## 2012-09-08 MED ORDER — HYDROMORPHONE HCL PF 1 MG/ML IJ SOLN
1.0000 mg | INTRAMUSCULAR | Status: DC | PRN
  Administered 2012-09-08: 1 mg via INTRAVENOUS
  Filled 2012-09-08: qty 1

## 2012-09-08 MED ORDER — INSULIN ASPART 100 UNIT/ML ~~LOC~~ SOLN
0.0000 [IU] | Freq: Three times a day (TID) | SUBCUTANEOUS | Status: DC
  Administered 2012-09-08 – 2012-09-10 (×5): 2 [IU] via SUBCUTANEOUS

## 2012-09-08 MED ORDER — HYDROMORPHONE HCL PF 1 MG/ML IJ SOLN
INTRAMUSCULAR | Status: AC
  Filled 2012-09-08: qty 1

## 2012-09-08 MED ORDER — LACTATED RINGERS IV SOLN
INTRAVENOUS | Status: DC | PRN
  Administered 2012-09-08 (×2): via INTRAVENOUS

## 2012-09-08 MED ORDER — BUPIVACAINE-EPINEPHRINE PF 0.25-1:200000 % IJ SOLN
INTRAMUSCULAR | Status: AC
  Filled 2012-09-08: qty 30

## 2012-09-08 MED ORDER — CELECOXIB 200 MG PO CAPS
200.0000 mg | ORAL_CAPSULE | Freq: Two times a day (BID) | ORAL | Status: DC
  Administered 2012-09-08 – 2012-09-10 (×5): 200 mg via ORAL
  Filled 2012-09-08 (×6): qty 1

## 2012-09-08 MED ORDER — SENNOSIDES-DOCUSATE SODIUM 8.6-50 MG PO TABS: 1.0000 | ORAL_TABLET | Freq: Every evening | ORAL | Status: DC | PRN

## 2012-09-08 MED ORDER — BISACODYL 5 MG PO TBEC: 5.0000 mg | DELAYED_RELEASE_TABLET | Freq: Every day | ORAL | Status: DC | PRN

## 2012-09-08 MED ORDER — OXYCODONE HCL 5 MG PO TABS
5.0000 mg | ORAL_TABLET | ORAL | Status: DC | PRN
Start: 2012-09-08 — End: 2012-09-10
  Administered 2012-09-08 – 2012-09-10 (×4): 10 mg via ORAL
  Filled 2012-09-08 (×4): qty 2

## 2012-09-08 MED ORDER — DIPHENHYDRAMINE HCL 12.5 MG/5ML PO ELIX: 12.5000 mg | ORAL_SOLUTION | ORAL | Status: DC | PRN

## 2012-09-08 MED ORDER — CEFAZOLIN SODIUM-DEXTROSE 2-3 GM-% IV SOLR
2.0000 g | Freq: Four times a day (QID) | INTRAVENOUS | Status: AC
  Administered 2012-09-08 (×2): 2 g via INTRAVENOUS
  Filled 2012-09-08 (×3): qty 50

## 2012-09-08 MED ORDER — HYDROMORPHONE HCL PF 1 MG/ML IJ SOLN
0.2500 mg | INTRAMUSCULAR | Status: DC | PRN
  Administered 2012-09-08 (×4): 0.5 mg via INTRAVENOUS

## 2012-09-08 MED ORDER — ACETAMINOPHEN 10 MG/ML IV SOLN
INTRAVENOUS | Status: AC
  Filled 2012-09-08: qty 100

## 2012-09-08 MED ORDER — ONDANSETRON HCL 4 MG/2ML IJ SOLN
INTRAMUSCULAR | Status: DC | PRN
  Administered 2012-09-08: 4 mg via INTRAVENOUS

## 2012-09-08 MED ORDER — ALUM & MAG HYDROXIDE-SIMETH 200-200-20 MG/5ML PO SUSP: 30.0000 mL | ORAL | Status: DC | PRN

## 2012-09-08 MED ORDER — ONDANSETRON HCL 4 MG PO TABS: 4.0000 mg | ORAL_TABLET | Freq: Four times a day (QID) | ORAL | Status: DC | PRN

## 2012-09-08 MED ORDER — FLEET ENEMA 7-19 GM/118ML RE ENEM: 1.0000 | ENEMA | Freq: Once | RECTAL | Status: AC | PRN

## 2012-09-08 MED ORDER — PANTOPRAZOLE SODIUM 40 MG PO TBEC
40.0000 mg | DELAYED_RELEASE_TABLET | Freq: Every day | ORAL | Status: DC
  Administered 2012-09-09 – 2012-09-10 (×2): 40 mg via ORAL
  Filled 2012-09-08 (×2): qty 1

## 2012-09-08 MED ORDER — BUPIVACAINE-EPINEPHRINE 0.25% -1:200000 IJ SOLN
INTRAMUSCULAR | Status: DC | PRN
  Administered 2012-09-08: 20 mL

## 2012-09-08 MED ORDER — BUPIVACAINE 0.25 % ON-Q PUMP SINGLE CATH 300ML
300.0000 mL | INJECTION | Status: DC
  Filled 2012-09-08: qty 300

## 2012-09-08 MED ORDER — MIDAZOLAM HCL 5 MG/5ML IJ SOLN
INTRAMUSCULAR | Status: DC | PRN
  Administered 2012-09-08: 1 mg via INTRAVENOUS

## 2012-09-08 MED ORDER — CLONAZEPAM 0.5 MG PO TABS
0.5000 mg | ORAL_TABLET | Freq: Every day | ORAL | Status: DC
  Administered 2012-09-08 – 2012-09-09 (×2): 0.5 mg via ORAL
  Filled 2012-09-08 (×2): qty 1

## 2012-09-08 MED ORDER — ENOXAPARIN SODIUM 30 MG/0.3ML ~~LOC~~ SOLN
30.0000 mg | Freq: Two times a day (BID) | SUBCUTANEOUS | Status: DC
  Administered 2012-09-09 – 2012-09-10 (×3): 30 mg via SUBCUTANEOUS
  Filled 2012-09-08 (×5): qty 0.3

## 2012-09-08 MED ORDER — GABAPENTIN 600 MG PO TABS
600.0000 mg | ORAL_TABLET | Freq: Every day | ORAL | Status: DC
  Administered 2012-09-08 – 2012-09-10 (×3): 600 mg via ORAL
  Filled 2012-09-08 (×3): qty 1

## 2012-09-08 MED ORDER — SIMVASTATIN 20 MG PO TABS
20.0000 mg | ORAL_TABLET | Freq: Every day | ORAL | Status: DC
  Administered 2012-09-08 – 2012-09-09 (×2): 20 mg via ORAL
  Filled 2012-09-08 (×3): qty 1

## 2012-09-08 MED ORDER — ACETAMINOPHEN 10 MG/ML IV SOLN
1000.0000 mg | Freq: Four times a day (QID) | INTRAVENOUS | Status: DC
  Administered 2012-09-08: 1000 mg via INTRAVENOUS
  Filled 2012-09-08 (×3): qty 100

## 2012-09-08 MED ORDER — PHENOL 1.4 % MT LIQD: 1.0000 | OROMUCOSAL | Status: DC | PRN

## 2012-09-08 SURGICAL SUPPLY — 59 items
BANDAGE ESMARK 6X9 LF (GAUZE/BANDAGES/DRESSINGS) ×1 IMPLANT
BLADE SAGITTAL 13X1.27X60 (BLADE) ×2 IMPLANT
BLADE SAW SGTL 83.5X18.5 (BLADE) ×2 IMPLANT
BNDG ESMARK 6X9 LF (GAUZE/BANDAGES/DRESSINGS) ×2
BOWL SMART MIX CTS (DISPOSABLE) ×2 IMPLANT
CATH KIT ON Q 5IN SLV (PAIN MANAGEMENT) ×2 IMPLANT
CEMENT BONE SIMPLEX SPEEDSET (Cement) ×4 IMPLANT
CLOTH BEACON ORANGE TIMEOUT ST (SAFETY) ×2 IMPLANT
COVER BACK TABLE 24X17X13 BIG (DRAPES) ×2 IMPLANT
COVER SURGICAL LIGHT HANDLE (MISCELLANEOUS) ×2 IMPLANT
CUFF TOURNIQUET SINGLE 34IN LL (TOURNIQUET CUFF) ×2 IMPLANT
DRAPE EXTREMITY T 121X128X90 (DRAPE) ×2 IMPLANT
DRAPE INCISE IOBAN 66X45 STRL (DRAPES) ×4 IMPLANT
DRAPE PROXIMA HALF (DRAPES) ×2 IMPLANT
DRAPE U-SHAPE 47X51 STRL (DRAPES) ×2 IMPLANT
DRSG ADAPTIC 3X8 NADH LF (GAUZE/BANDAGES/DRESSINGS) ×2 IMPLANT
DRSG PAD ABDOMINAL 8X10 ST (GAUZE/BANDAGES/DRESSINGS) ×2 IMPLANT
DURAPREP 26ML APPLICATOR (WOUND CARE) ×2 IMPLANT
ELECT REM PT RETURN 9FT ADLT (ELECTROSURGICAL) ×2
ELECTRODE REM PT RTRN 9FT ADLT (ELECTROSURGICAL) ×1 IMPLANT
EVACUATOR 1/8 PVC DRAIN (DRAIN) ×2 IMPLANT
GLOVE BIOGEL M 7.0 STRL (GLOVE) IMPLANT
GLOVE BIOGEL PI IND STRL 7.5 (GLOVE) IMPLANT
GLOVE BIOGEL PI IND STRL 8.5 (GLOVE) ×1 IMPLANT
GLOVE BIOGEL PI INDICATOR 7.5 (GLOVE)
GLOVE BIOGEL PI INDICATOR 8.5 (GLOVE) ×1
GLOVE BIOGEL PI ORTHO PRO SZ8 (GLOVE) ×1
GLOVE PI ORTHO PRO STRL SZ8 (GLOVE) ×1 IMPLANT
GLOVE SURG ORTHO 8.0 STRL STRW (GLOVE) ×4 IMPLANT
GOWN PREVENTION PLUS XLARGE (GOWN DISPOSABLE) ×4 IMPLANT
GOWN STRL NON-REIN LRG LVL3 (GOWN DISPOSABLE) ×2 IMPLANT
HANDPIECE INTERPULSE COAX TIP (DISPOSABLE) ×1
HOOD PEEL AWAY FACE SHEILD DIS (HOOD) ×6 IMPLANT
KIT BASIN OR (CUSTOM PROCEDURE TRAY) ×2 IMPLANT
KIT ROOM TURNOVER OR (KITS) ×2 IMPLANT
MANIFOLD NEPTUNE II (INSTRUMENTS) ×2 IMPLANT
NEEDLE 22X1 1/2 (OR ONLY) (NEEDLE) ×2 IMPLANT
NS IRRIG 1000ML POUR BTL (IV SOLUTION) IMPLANT
PACK TOTAL JOINT (CUSTOM PROCEDURE TRAY) ×2 IMPLANT
PAD ARMBOARD 7.5X6 YLW CONV (MISCELLANEOUS) ×4 IMPLANT
PAD CAST 4YDX4 CTTN HI CHSV (CAST SUPPLIES) ×1 IMPLANT
PADDING CAST COTTON 4X4 STRL (CAST SUPPLIES) ×1
PADDING CAST COTTON 6X4 STRL (CAST SUPPLIES) ×2 IMPLANT
POSITIONER HEAD PRONE TRACH (MISCELLANEOUS) ×2 IMPLANT
SET HNDPC FAN SPRY TIP SCT (DISPOSABLE) ×1 IMPLANT
SPONGE GAUZE 4X4 12PLY (GAUZE/BANDAGES/DRESSINGS) ×2 IMPLANT
STAPLER VISISTAT 35W (STAPLE) ×2 IMPLANT
SUCTION FRAZIER TIP 10 FR DISP (SUCTIONS) ×2 IMPLANT
SUT BONE WAX W31G (SUTURE) ×2 IMPLANT
SUT VIC AB 0 CTB1 27 (SUTURE) ×4 IMPLANT
SUT VIC AB 1 CT1 27 (SUTURE) ×3
SUT VIC AB 1 CT1 27XBRD ANBCTR (SUTURE) ×3 IMPLANT
SUT VIC AB 2-0 CT1 27 (SUTURE) ×2
SUT VIC AB 2-0 CT1 TAPERPNT 27 (SUTURE) ×2 IMPLANT
SYR CONTROL 10ML LL (SYRINGE) ×2 IMPLANT
TOWEL OR 17X24 6PK STRL BLUE (TOWEL DISPOSABLE) ×2 IMPLANT
TOWEL OR 17X26 10 PK STRL BLUE (TOWEL DISPOSABLE) ×2 IMPLANT
TRAY FOLEY CATH 14FR (SET/KITS/TRAYS/PACK) IMPLANT
WATER STERILE IRR 1000ML POUR (IV SOLUTION) ×6 IMPLANT

## 2012-09-08 NOTE — Progress Notes (Signed)
Report given to stephanie rn as caregiver 

## 2012-09-08 NOTE — H&P (Signed)
[name] MRN:  [id number] DOB/SEX:  1933/12/24/male  CHIEF COMPLAINT:  Painful left Knee  HISTORY: Patient is a 78 y.o. male presented with a history of pain in the left knee. Onset of symptoms was gradual starting several years ago with gradually worsening course since that time. The patient noted no past surgery on the left knee. Prior procedures on the knee include meniscectomy. Patient has been treated conservatively with over-the-counter NSAIDs and activity modification. Patient currently rates pain in the knee at 10 out of 10 with activity. There is pain at night.  PAST MEDICAL HISTORY: There are no active problems to display for this patient.  Past Medical History  Diagnosis Date  . Myocardial infarction     1990  . Hypertension   . Diabetes mellitus   . GERD (gastroesophageal reflux disease)   . Neuromuscular disorder     neuropathy  . 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 Arthritis   . Hyperlipidemia    Past Surgical History  Procedure Date  . Joint replacement     right knee  . 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 Cervical laminectomy   . Rotator cuff repair   . Back surgery   . Prostate surgery      MEDICATIONS:   Prescriptions prior to admission  Medication Sig Dispense Refill  . aspirin EC 81 MG tablet Take 81 mg by mouth daily.      . Calcium Carbonate-Vitamin D (CALCIUM 500 + D PO) Take 1 tablet by mouth 2 (two) times daily with a meal.      . clonazePAM (KLONOPIN) 1 MG tablet Take 0.5 mg by mouth at bedtime.      . gabapentin (NEURONTIN) 600 MG tablet Take 600 mg by mouth daily.      Marland Kitchen glipiZIDE (GLUCOTROL XL) 5 MG 24 hr tablet Take 5 mg by mouth daily with breakfast.      . metolazone (ZAROXOLYN) 2.5 MG tablet Take 2.5 mg by mouth every other day as needed. For swelling in  legs      . metoprolol succinate (TOPROL-XL) 50 MG 24 hr tablet Take 50 mg by mouth daily. Take with or immediately following a meal.      . Omega-3  Fatty Acids (FISH OIL) 500 MG CAPS Take 500 mg by mouth 2 (two) times daily.      Marland Kitchen omeprazole (PRILOSEC) 20 MG capsule Take 20 mg by mouth daily.      Marland Kitchen oxycodone-acetaminophen (ROXICET) 5-500 MG per tablet Take 1 tablet by mouth every 8 (eight) hours as needed. For pain      . potassium chloride SA (K-DUR,KLOR-CON) 20 MEQ tablet Take 20 mEq by mouth 2 (two) times daily.      . pravastatin (PRAVACHOL) 40 MG tablet Take 20 mg by mouth daily.      . quinapril (ACCUPRIL) 20 MG tablet Take 20 mg by mouth daily.      Marland Kitchen torsemide (DEMADEX) 20 MG tablet Take 40 mg by mouth 2 (two) times daily with breakfast and lunch.        ALLERGIES:  No Known Allergies  REVIEW OF SYSTEMS:  Pertinent items are noted in HPI.   FAMILY HISTORY:  History reviewed. No pertinent family history.  SOCIAL HISTORY:   History  Substance Use Topics  . Smoking status: Former Smoker -- 1.0 packs/day for 12 years    Types: Cigarettes  . Smokeless tobacco:  Former Neurosurgeon    Types: Chew  . Alcohol Use: No     EXAMINATION:  Vital signs in last 24 hours: Temp:  [98.3 F (36.8 C)] 98.3 F (36.8 C) (10/14 0556) Pulse Rate:  [77] 77  (10/14 0556) Resp:  [18] 18  (10/14 0556) BP: (115)/(71) 115/71 mmHg (10/14 0556) SpO2:  [94 %] 94 % (10/14 0556)  General appearance: alert, cooperative and no distress Lungs: clear to auscultation bilaterally Heart: regular rate and rhythm, S1, S2 normal, no murmur, click, rub or gallop Abdomen: soft, non-tender; bowel sounds normal; no masses,  no organomegaly Extremities: extremities normal, atraumatic, no cyanosis or edema and Homans sign is negative, no sign of DVT Pulses: 2+ and symmetric Skin: Skin color, texture, turgor normal. No rashes or lesions Neurologic: Alert and oriented X 3, normal strength and tone. Normal symmetric reflexes. Normal coordination and gait  Musculoskeletal:  ROM 0-110, Ligaments intact,  Imaging Review Plain radiographs demonstrate severe  degenerative joint disease of the left knee. The overall alignment is mild varus. The bone quality appears to be good for age and reported activity level.  Assessment/Plan: End stage arthritis, left knee   The patient history, physical examination and imaging studies are consistent with advanced degenerative joint disease of the left knee. The patient has failed conservative treatment.  The clearance notes were reviewed.  After discussion with the patient it was felt that Total Knee Replacement was indicated. The procedure,  risks, and benefits of total knee arthroplasty were presented and reviewed. The risks including but not limited to aseptic loosening, infection, blood clots, vascular injury, stiffness, patella tracking problems complications among others were discussed. The patient acknowledged the explanation, agreed to proceed with the plan.  Melony Tenpas 09/08/2012, 7:18 AM

## 2012-09-08 NOTE — Plan of Care (Signed)
Problem: Consults Goal: Diagnosis- Total Joint Replacement Primary Total Knee     

## 2012-09-08 NOTE — Anesthesia Procedure Notes (Signed)
Anesthesia Regional Block:  Femoral nerve block  Pre-Anesthetic Checklist: ,, timeout performed, Correct Patient, Correct Site, Correct Laterality, Correct Procedure,, site marked, risks and benefits discussed, Surgical consent,  Pre-op evaluation,  At surgeon's request and post-op pain management  Laterality: Left  Prep: chloraprep       Needles:  Injection technique: Single-shot  Needle Type: Echogenic Stimulator Needle     Needle Length: 9cm  Needle Gauge: 21    Additional Needles:  Procedures: nerve stimulator Femoral nerve block  Nerve Stimulator or Paresthesia:  Response: Quadriceps muscle contraction, 0.45 mA,   Additional Responses:   Narrative:  Start time: 09/08/2012 7:00 AM End time: 09/08/2012 7:14 AM Injection made incrementally with aspirations every 5 mL.  Performed by: Personally  Anesthesiologist: Dr Chaney Malling  Additional Notes: Functioning IV was confirmed and monitors were applied.  A 90mm 21ga Arrow echogenic stimulator needle was used. Sterile prep and drape,hand hygiene and sterile gloves were used.  Negative aspiration and negative test dose prior to incremental administration of local anesthetic. The patient tolerated the procedure well.    Femoral nerve block

## 2012-09-08 NOTE — Anesthesia Preprocedure Evaluation (Addendum)
Anesthesia Evaluation  Patient identified by MRN, date of birth, ID band Patient awake    Reviewed: Allergy & Precautions, H&P , NPO status , Patient's Chart, lab work & pertinent test results  Airway Mallampati: II  Neck ROM: full    Dental   Pulmonary          Cardiovascular hypertension, Pt. on medications and Pt. on home beta blockers + CAD and + Past MI     Neuro/Psych  Neuromuscular disease    GI/Hepatic GERD-  Controlled,  Endo/Other  diabetes, Type 2  Renal/GU      Musculoskeletal   Abdominal   Peds  Hematology   Anesthesia Other Findings   Reproductive/Obstetrics                          Anesthesia Physical Anesthesia Plan  ASA: III  Anesthesia Plan: General and Regional   Post-op Pain Management: MAC Combined w/ Regional for Post-op pain   Induction: Intravenous  Airway Management Planned: Oral ETT  Additional Equipment:   Intra-op Plan:   Post-operative Plan: Extubation in OR  Informed Consent: I have reviewed the patients History and Physical, chart, labs and discussed the procedure including the risks, benefits and alternatives for the proposed anesthesia with the patient or authorized representative who has indicated his/her understanding and acceptance.   Dental advisory given  Plan Discussed with: CRNA, Surgeon and Anesthesiologist  Anesthesia Plan Comments:        Anesthesia Quick Evaluation

## 2012-09-08 NOTE — Progress Notes (Signed)
Orthopedic Tech Progress Note Patient Details:  [name] [birth year] [id number] CPM applied with appropriate settings. OHF applied to bed. CPM Left Knee CPM Left Knee: On Left Knee Flexion (Degrees): 90  Left Knee Extension (Degrees): 0    [name] 09/08/2012, 10:22 AM

## 2012-09-08 NOTE — Evaluation (Signed)
Physical Therapy Evaluation Patient Details Name: [name] MRN: [id number] DOB: 05-29-1934 Today's Date: 09/08/2012 Time: 8295-6213 PT Time Calculation (min): 44 min  PT Assessment / Plan / Recommendation Clinical Impression  Pt is a 78 y/o male s/p L TKA.  Acute PT to follow pt to maximize mobility for return to home with support of his wife.      PT Assessment  Patient needs continued PT services    Follow Up Recommendations  Home health PT;Supervision - Intermittent    Does the patient have the potential to tolerate intense rehabilitation      Barriers to Discharge None      Equipment Recommendations  None recommended by PT    Recommendations for Other Services     Frequency 7X/week    Precautions / Restrictions Precautions Precautions: Knee Precaution Booklet Issued: Yes (comment) Restrictions Weight Bearing Restrictions: Yes LLE Weight Bearing: Weight bearing as tolerated   Pertinent Vitals/Pain Pt reporting pain in knee is 5/10.  RN medicated pt during session.  Repositioned Knee for pain relief. Educated pt in knee positioning to promote knee extension, and use of cryotherapy to minimize pain and edema.        Mobility  Bed Mobility Bed Mobility: Supine to Sit;Sitting - Scoot to Edge of Bed Supine to Sit: 4: Min guard Sitting - Scoot to [place] of Bed: 4: Min guard Details for Bed Mobility Assistance: Pt able to manage L LE with out assisstance.   Transfers Transfers: Sit to Stand;Stand to Sit Sit to Stand: 4: Min guard;With upper extremity assist;From bed;From elevated surface Stand to Sit: 4: Min assist;With upper extremity assist;To chair/3-in-1;With armrests Details for Transfer Assistance: Cues for hand placement and controlled descent to chair.  Ambulation/Gait Ambulation/Gait Assistance: 4: Min guard Ambulation Distance (Feet): 40 Feet Assistive device: Rolling walker Ambulation/Gait Assistance Details: Cues for gait sequencing and WBAT on L  LE.  Pt c/o UE fatigue.   Gait Pattern: Step-to pattern;Decreased stride length;Decreased weight shift to left Stairs: No Wheelchair Mobility Wheelchair Mobility: No    Shoulder Instructions     Exercises Total Joint Exercises Ankle Circles/Pumps: Both;10 reps;Seated   PT Diagnosis: Difficulty walking;Generalized weakness;Acute pain  PT Problem List: Decreased strength;Decreased range of motion;Decreased activity tolerance;Decreased mobility;Decreased knowledge of use of DME;Decreased knowledge of precautions;Pain;Obesity PT Treatment Interventions: DME instruction;Gait training;Stair training;Functional mobility training;Therapeutic exercise;Therapeutic activities;Patient/family education   PT Goals Acute Rehab PT Goals PT Goal Formulation: With patient Time For Goal Achievement: 09/15/12 Potential to Achieve Goals: Good Pt will go Supine/Side to Sit: with modified independence PT Goal: Supine/Side to Sit - Progress: Goal set today Pt will go Sit to Supine/Side: with modified independence PT Goal: Sit to Supine/Side - Progress: Goal set today Pt will Transfer Bed to Chair/Chair to Bed: with modified independence PT Transfer Goal: Bed to Chair/Chair to Bed - Progress: Goal set today Pt will Ambulate: >150 feet;with modified independence;with rolling walker PT Goal: Ambulate - Progress: Goal set today Pt will Go Up / Down Stairs: 3-5 stairs;with supervision;with rolling walker PT Goal: Up/Down Stairs - Progress: Goal set today Pt will Perform Home Exercise Program: Independently PT Goal: Perform Home Exercise Program - Progress: Goal set today  Visit Information  Last PT Received On: 09/08/12 Assistance Needed: +1    Subjective Data  Subjective: Agree to PT eval.   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     Prior Functioning  Home Living Lives With: Spouse Available Help at Discharge: Family;Available  24 hours/day Type of Home: House Home Access: Stairs to  enter [employer] of Steps: 3 Entrance Stairs-Rails: Can reach both Home Layout: One level Bathroom Shower/Tub: Walk-in shower;Door [employer] Toilet: Standard Bathroom Accessibility: Yes How Accessible: Accessible via walker Home Adaptive Equipment: Walker - rolling;Straight cane;Bedside commode/3-in-1;Hand-held shower hose Prior Function Level of Independence: Independent with assistive device(s) (Ambulate with cane.  ) Able to Take Stairs?: Yes Driving: Yes Vocation: Full time employment Communication Communication: No difficulties Dominant Hand: Right    Cognition  Overall Cognitive Status: Appears within functional limits for tasks assessed/performed Arousal/Alertness: Awake/alert Orientation Level: Appears intact for tasks assessed Behavior During Session: [facility] for tasks performed    Extremity/Trunk Assessment Right Upper Extremity Assessment RUE ROM/Strength/Tone: Within functional levels Left Upper Extremity Assessment LUE ROM/Strength/Tone: Within functional levels Right Lower Extremity Assessment RLE ROM/Strength/Tone: Within functional levels Left Lower Extremity Assessment LLE ROM/Strength/Tone: Deficits;Due to pain LLE ROM/Strength/Tone Deficits: Strength and ROM limited secondary to surgery.     Balance Balance Balance Assessed: No  End of Session PT - End of Session Equipment Utilized During Treatment: Gait belt Activity Tolerance: Patient tolerated treatment well Patient left: in chair;with call bell/phone within reach;with family/visitor present Nurse Communication: Mobility status;Patient requests pain meds;Weight bearing status CPM Left Knee CPM Left Knee: Off Left Knee Flexion (Degrees): 90  Left Knee Extension (Degrees): 0   GP     [name] 09/08/2012, 5:06 PM  [name] DPT [phone]

## 2012-09-08 NOTE — Anesthesia Postprocedure Evaluation (Signed)
Anesthesia Post Note  Patient: [name]  Procedure(s) Performed: Procedure(s) (LRB): TOTAL KNEE ARTHROPLASTY (Left)  Anesthesia type: General  Patient location: PACU  Post pain: Pain level controlled and Adequate analgesia  Post assessment: Post-op Vital signs reviewed, Patient's Cardiovascular Status Stable, Respiratory Function Stable, Patent Airway and Pain level controlled  Last Vitals:  Filed Vitals:   09/08/12 1000  BP: 153/73  Pulse: 85  Temp:   Resp: 23    Post vital signs: Reviewed and stable  Level of consciousness: awake, alert  and oriented  Complications: No apparent anesthesia complications

## 2012-09-08 NOTE — Preoperative (Signed)
Beta Blockers   Reason not to administer Beta Blockers:Metoprolol at 0400hrs 09/08/12

## 2012-09-08 NOTE — Transfer of Care (Signed)
Immediate Anesthesia Transfer of Care Note  Patient: [name]  Procedure(s) Performed: Procedure(s) (LRB) with comments: TOTAL KNEE ARTHROPLASTY (Left) - left total knee arthroplasty  Patient Location: PACU  Anesthesia Type: GA combined with regional for post-op pain  Level of Consciousness: awake and alert   Airway & Oxygen Therapy: Patient Spontanous Breathing and Patient connected to nasal cannula oxygen  Post-op Assessment: Report given to PACU RN, Post -op Vital signs reviewed and stable and Patient moving all extremities  Post vital signs: Reviewed and stable  Complications: No apparent anesthesia complications

## 2012-09-09 ENCOUNTER — Encounter (HOSPITAL_COMMUNITY): Payer: Self-pay | Admitting: Orthopedic Surgery

## 2012-09-09 LAB — GLUCOSE, CAPILLARY
Glucose-Capillary: 137 mg/dL — ABNORMAL HIGH (ref 70–99)
Glucose-Capillary: 102 mg/dL — ABNORMAL HIGH (ref 70–99)

## 2012-09-09 LAB — CBC
Hemoglobin: 12.1 g/dL — ABNORMAL LOW (ref 13.0–17.0)
MCH: 28.3 pg (ref 26.0–34.0)
MCHC: 33.2 g/dL (ref 30.0–36.0)
MCV: 85.3 fL (ref 78.0–100.0)
RBC: 4.28 MIL/uL (ref 4.22–5.81)

## 2012-09-09 LAB — BASIC METABOLIC PANEL
BUN: 13 mg/dL (ref 6–23)
CO2: 29 mEq/L (ref 19–32)
Calcium: 7.9 mg/dL — ABNORMAL LOW (ref 8.4–10.5)
Creatinine, Ser: 0.87 mg/dL (ref 0.50–1.35)
GFR calc non Af Amer: 82 mL/min — ABNORMAL LOW (ref >90)
Glucose, Bld: 146 mg/dL — ABNORMAL HIGH (ref 70–99)
Sodium: 135 mEq/L (ref 135–145)

## 2012-09-09 MED ORDER — CELECOXIB 200 MG PO CAPS: 200.0000 mg | ORAL_CAPSULE | Freq: Two times a day (BID) | ORAL | Status: DC

## 2012-09-09 MED ORDER — OXYCODONE HCL 5 MG PO TABS: 5.0000 mg | ORAL_TABLET | ORAL | Status: DC | PRN

## 2012-09-09 MED ORDER — OXYCODONE HCL 10 MG PO TB12: 10.0000 mg | ORAL_TABLET | Freq: Two times a day (BID) | ORAL | Status: DC

## 2012-09-09 MED ORDER — ENOXAPARIN SODIUM 40 MG/0.4ML ~~LOC~~ SOLN: 40.0000 mg | Freq: Every day | SUBCUTANEOUS | Status: DC

## 2012-09-09 MED ORDER — METHOCARBAMOL 500 MG PO TABS: 500.0000 mg | ORAL_TABLET | Freq: Four times a day (QID) | ORAL | Status: DC | PRN

## 2012-09-09 NOTE — Progress Notes (Signed)
Physical Therapy Progress Note   09/09/12 1200  PT Visit Information  Last PT Received On 09/09/12  Assistance Needed +1  PT Time Calculation  PT Start Time 1121  PT Stop Time 1208  PT Time Calculation (min) 47 min  Subjective Data  Subjective Feeling fine.  Patient Stated Goal To get better  Precautions  Precautions Knee  Restrictions  Weight Bearing Restrictions Yes  LLE Weight Bearing WBAT  Cognition  Overall Cognitive Status Appears within functional limits for tasks assessed/performed  Arousal/Alertness Awake/alert  Orientation Level Appears intact for tasks assessed  Behavior During Session [facility] for tasks performed  Bed Mobility  Bed Mobility Not assessed  Transfers  Transfers Sit to Stand;Stand to Sit  Sit to Stand 4: Min guard;With upper extremity assist;From chair/3-in-1;From toilet  Stand to Sit 4: Min guard;With upper extremity assist;To chair/3-in-1;To toilet  Details for Transfer Assistance Cues for proper technique, hand placement, and safety awareness.  Ambulation/Gait  Ambulation/Gait Assistance 4: Min assist  Ambulation Distance (Feet) 120 Feet  Assistive device Rolling walker  Ambulation/Gait Assistance Details (A) with RW placement and balance.  Max cues for proper step sequence, posture, proper technique, and safety awareness.  Gait Pattern Step-to pattern;Decreased stride length;Antalgic;Trunk flexed  Gait velocity decreased  Stairs Yes  Stairs Assistance 4: Min assist  Stairs Assistance Details (indicate cue type and reason) (A) with RW placement.  Cues for proper step sequence, posture, and safety awareness.  Stair Management Technique Two rails;Backwards;With walker  Number of Stairs 3  ((2 trials))  Wheelchair Mobility  Wheelchair Mobility No  Balance  Balance Assessed No  PT - End of Session  Equipment Utilized During Treatment Gait belt  Activity Tolerance Patient tolerated treatment well  Patient left in chair;with call bell/phone within  reach;with family/visitor present  Nurse Communication Mobility status  PT - Assessment/Plan  Comments on Treatment Session Pt able to increase ambulation distance and perform stair negotiation.  Pt required max cues for proper step sequence with RW.  Plan for next session to review HEP and increase ambulation distance. Goals updated to include transfers.  PT Plan Discharge plan remains appropriate;Frequency remains appropriate  PT Frequency 7X/week  Recommendations for Other Services Other (comment) (None)  Follow Up Recommendations Home health PT;Supervision - Intermittent  Equipment Recommended None recommended by PT  Acute Rehab PT Goals  PT Goal Formulation With patient  Time For Goal Achievement 09/15/12  Potential to Achieve Goals Good  Pt will go Sit to Stand with modified independence;with upper extremity assist  PT Goal: Sit to Stand - Progress Goal set today  Pt will go Stand to Sit with modified independence;with upper extremity assist  PT Goal: Stand to Sit - Progress Goal set today  Pt will Ambulate >150 feet;with modified independence;with rolling walker  PT Goal: Ambulate - Progress Progressing toward goal  Pt will Go Up / Down Stairs 3-5 stairs;with supervision;with rolling walker  PT Goal: Up/Down Stairs - Progress Met  Pt will Perform Home Exercise Program Independently  PT Goal: Perform Home Exercise Program - Progress Progressing toward goal    4/10 L LE  Hulon Ferron, SPT

## 2012-09-09 NOTE — Progress Notes (Signed)
Ethan Spurling, MD   Ethan Cabal, PA-C [address] [place], [place], [place]  [address]                             [phone]   PROGRESS NOTE  Subjective:  negative for Chest Pain  negative for Shortness of Breath  negative for Nausea/Vomiting   negative for Calf Pain  negative for Bowel Movement   Tolerating Diet: yes         Patient reports pain as 5 on 0-10 scale.    Objective: Vital signs in last 24 hours:   Patient Vitals for the past 24 hrs:  BP Temp Pulse Resp SpO2 Height Weight  09/09/12 0609 113/55 mmHg 98.2 F (36.8 C) 88  16  97 % - -  09/09/12 0349 - - - 16  100 % - -  09/09/12 0158 136/63 mmHg 98.1 F (36.7 C) 100  18  96 % - -  09/08/12 2302 - - - 14  93 % - -  09/08/12 2227 - - - - - 5\' 9"  (1.753 m) 98 kg (216 lb 0.8 oz)  09/08/12 2009 119/51 mmHg 97.7 F (36.5 C) 80  18  97 % - -  09/08/12 1944 - - - 20  100 % - -  09/08/12 1045 124/59 mmHg 97.7 F (36.5 C) 81  13  99 % - -  09/08/12 1030 138/66 mmHg 96.8 F (36 C) 84  13  98 % - -  09/08/12 1015 155/73 mmHg - 86  26  97 % - -  09/08/12 1000 153/73 mmHg - 85  23  95 % - -  09/08/12 0945 158/74 mmHg - 87  17  98 % - -  09/08/12 0930 162/72 mmHg 96.8 F (36 C) 89  15  96 % - -    @flow {1959:LAST@   Intake/Output from previous day:   10/14 0701 - 10/15 0700 In: 1555 [I.V.:1000] Out: 2150 [Urine:1300; Drains:850]   Intake/Output this shift:       Intake/Output      10/14 0701 - 10/15 0700 10/15 0701 - 10/16 0700   I.V. (mL/kg) 1000 (10.2)    IV Piggyback 555    Total Intake(mL/kg) 1555 (15.9)    Urine (mL/kg/hr) 1300 (0.6)    Drains 850    Total Output 2150    Net -595            LABORATORY DATA:  Basename 09/09/12 0605  WBC 9.2  HGB 12.1*  HCT 36.5*  PLT 140*    Basename 09/09/12 0605  NA 135  K 3.4*  CL 100  CO2 29  BUN 13  CREATININE 0.87  GLUCOSE 146*  CALCIUM 7.9*   Lab Results  Component Value Date   INR 1.02 09/01/2012   INR 1.0 09/29/2008     Examination:  General appearance: alert, cooperative and no distress Extremities: Homans sign is negative, no sign of DVT  Wound Exam: clean, dry, intact   Drainage:  None: wound tissue dry  Motor Exam: EHL and FHL Intact  Sensory Exam: Superficial Peroneal normal  Vascular Exam:    Assessment:    1 Day Post-Op  Procedure(s) (LRB): TOTAL KNEE ARTHROPLASTY (Left)  ADDITIONAL DIAGNOSIS:  Active Problems:  * No active hospital problems. *   Acute Blood Loss Anemia   Plan: Physical Therapy as ordered Weight Bearing as Tolerated (WBAT)  DVT Prophylaxis:  Lovenox  DISCHARGE PLAN: Home  DISCHARGE NEEDS: HHPT, CPM, Walker and 3-in-1 comode seat         [name] 09/09/2012, 7:25 AM

## 2012-09-09 NOTE — Progress Notes (Signed)
Physical Therapy Treatment Patient Details Name: [name] MRN: [id number] DOB: July 04, 1934 Today's Date: 09/09/2012 Time: 4098-1191 PT Time Calculation (min): 45 min  PT Assessment / Plan / Recommendation Comments on Treatment Session  Pt able to increase ambulation distance and required cueing to maintain upright posture.  Plan for next session is to perform stair negotiation and increase ambulation distance.    Follow Up Recommendations  Home health PT;Supervision - Intermittent     Does the patient have the potential to tolerate intense rehabilitation     Barriers to Discharge        Equipment Recommendations  None recommended by PT    Recommendations for Other Services Other (comment) (None)  Frequency 7X/week   Plan Discharge plan remains appropriate;Frequency remains appropriate    Precautions / Restrictions Precautions Precautions: Knee Restrictions Weight Bearing Restrictions: Yes LLE Weight Bearing: Weight bearing as tolerated   Pertinent Vitals/Pain Pain in L LE did not rate.   Mobility  Bed Mobility Bed Mobility: Supine to Sit;Sitting - Scoot to Edge of Bed Supine to Sit: 4: Min assist;HOB flat Sitting - Scoot to [place] of Bed: 4: Min guard Details for Bed Mobility Assistance: (A) with supporting upper body into up right position.  Cues for proper hand placement and proper technique. Transfers Transfers: Sit to Stand;Stand to Sit Sit to Stand: 4: Min guard;From bed;From toilet Stand to Sit: 4: Min guard;With upper extremity assist;To chair/3-in-1;To toilet Details for Transfer Assistance: Cues for hand placement, proper technique, and safety awareness. Ambulation/Gait Ambulation/Gait Assistance: 4: Min assist Ambulation Distance (Feet): 85 Feet Assistive device: Rolling walker Ambulation/Gait Assistance Details: (A) with RW placement.  Cues for proper WB restrictions, proper technique, and safety awareness. Gait Pattern: Step-to pattern;Decreased  stride length;Antalgic;Trunk flexed Gait velocity: decreased Stairs: No Wheelchair Mobility Wheelchair Mobility: No    Exercises Total Joint Exercises Ankle Circles/Pumps: AROM;10 reps;Both Quad Sets: AROM;Left;10 reps Heel Slides: AAROM;Left;10 reps   PT Diagnosis:    PT Problem List:   PT Treatment Interventions:     PT Goals Acute Rehab PT Goals PT Goal Formulation: With patient Time For Goal Achievement: 09/15/12 Potential to Achieve Goals: Good Pt will go Supine/Side to Sit: with modified independence PT Goal: Supine/Side to Sit - Progress: Progressing toward goal Pt will Ambulate: >150 feet;with modified independence;with rolling walker PT Goal: Ambulate - Progress: Progressing toward goal Pt will Perform Home Exercise Program: Independently PT Goal: Perform Home Exercise Program - Progress: Progressing toward goal  Visit Information  Last PT Received On: 09/09/12 Assistance Needed: +1    Subjective Data  Subjective: Feeling well this morning. Patient Stated Goal: To get better   Cognition  Overall Cognitive Status: Appears within functional limits for tasks assessed/performed Arousal/Alertness: Awake/alert Orientation Level: Appears intact for tasks assessed Behavior During Session: [facility] for tasks performed    Balance  Balance Balance Assessed: No  End of Session PT - End of Session Equipment Utilized During Treatment: Gait belt Activity Tolerance: Patient tolerated treatment well Patient left: in chair;with call bell/phone within reach;with family/visitor present Nurse Communication: Mobility status CPM Left Knee CPM Left Knee: Off   GP     [name] 09/09/2012, 10:15 AM

## 2012-09-09 NOTE — Progress Notes (Signed)
Occupational Therapy Evaluation Patient Details Name: [name] MRN: [id number] DOB: 1934/11/15 Today's Date: 09/09/2012 Time: 3244-0102 OT Time Calculation (min): 41 min  OT Assessment / Plan / Recommendation Clinical Impression  Pt. 78 yo male s/p left TKA. Pt. doing very well with transfers and ambulation. Pt. able to complete ADL's at supervision to mod independence level. Wife will be at home to (A) with any needs. No further acute OT needs at this time.     OT Assessment  Patient does not need any further OT services    Follow Up Recommendations  Supervision - Intermittent    Barriers to Discharge      Equipment Recommendations  None recommended by OT    Recommendations for Other Services    Frequency       Precautions / Restrictions Precautions Precautions: Knee Restrictions Weight Bearing Restrictions: Yes LLE Weight Bearing: Weight bearing as tolerated   Pertinent Vitals/Pain No pain reported by pt    ADL  Grooming: Performed;Wash/dry hands;Wash/dry face;Teeth care;Supervision/safety Where Assessed - Grooming: Supported standing Upper Body Bathing: Performed;Modified independent Where Assessed - Upper Body Bathing: Unsupported sitting Lower Body Bathing: Performed;Supervision/safety Where Assessed - Lower Body Bathing: Supported sit to stand Upper Body Dressing: Performed;Supervision/safety Where Assessed - Upper Body Dressing: Unsupported sitting Lower Body Dressing: Simulated;Supervision/safety Where Assessed - Lower Body Dressing: Supported sit to stand Toilet Transfer: Research scientist (life sciences) Method: Sit to Barista: Raised toilet seat with arms (or 3-in-1 over toilet) Toileting - Clothing Manipulation and Hygiene: Performed;Independent Where Assessed - Toileting Clothing Manipulation and Hygiene: Sit to stand from 3-in-1 or toilet Tub/Shower Transfer: Simulated;Supervision/safety Tub/Shower Transfer  Method: Science writer: Walk in shower Equipment Used: Rolling walker Transfers/Ambulation Related to ADLs: Pt. was supervision for transfers and ambulation, requiring min verbal cues for hand placement.  ADL Comments: Pt. LB dressed upon arrival in room. Pt. and wife stated pt. is able to dress LB without any (A) this morning. Pt. completed LB bathing sit<>stand and grooming tasks standing at sink level with supervision for safety. Simulated a walk in shower transfer in room with pt and wife, pt is supervision level for safety with min verbal cues for sequencing.      OT Diagnosis:    OT Problem List:   OT Treatment Interventions:     OT Goals    Visit Information  Last OT Received On: 09/09/12 Assistance Needed: +1    Subjective Data  Subjective: I am feeling pretty good Patient Stated Goal: To return home    Prior Functioning     Home Living Lives With: Spouse Available Help at Discharge: Family;Available 24 hours/day Type of Home: House Home Access: Stairs to enter [employer] of Steps: 3 Entrance Stairs-Rails: Can reach both Home Layout: One level Bathroom Shower/Tub: Walk-in shower;Door [employer] Toilet: Standard Bathroom Accessibility: Yes How Accessible: Accessible via walker Home Adaptive Equipment: Walker - rolling;Straight cane;Bedside commode/3-in-1;Hand-held shower hose Prior Function Level of Independence: Independent with assistive device(s) Able to Take Stairs?: Yes Driving: Yes Vocation: Full time employment Communication Communication: No difficulties Dominant Hand: Right         Vision/Perception     Cognition  Overall Cognitive Status: Appears within functional limits for tasks assessed/performed Arousal/Alertness: Awake/alert Orientation Level: Appears intact for tasks assessed Behavior During Session: [facility] for tasks performed    Extremity/Trunk Assessment Right Upper Extremity Assessment RUE  ROM/Strength/Tone: Within functional levels Left Upper Extremity Assessment LUE ROM/Strength/Tone: Within functional levels     Mobility  Bed Mobility Bed Mobility: Supine to Sit;Sit to Supine;Sitting - Scoot to Edge of Bed Supine to Sit: 5: Supervision;HOB elevated Sitting - Scoot to [place] of Bed: 5: Supervision Sit to Supine: 5: Supervision;HOB flat Details for Bed Mobility Assistance: Able to complete bed mobilty at supervision level this afternoon, requires extra time for sit to supine.  Transfers Transfers: Sit to Stand;Stand to Sit Sit to Stand: 5: Supervision;With upper extremity assist;From bed;From chair/3-in-1 Stand to Sit: 5: Supervision;With upper extremity assist;To bed;To chair/3-in-1 Details for Transfer Assistance: cues for safe hand placement                Balance Balance Balance Assessed: No   End of Session OT - End of Session Activity Tolerance: Patient tolerated treatment well Patient left: in chair;with call bell/phone within reach;with family/visitor present Nurse Communication: Mobility status  GO     [name] 09/09/2012, 2:35 PM

## 2012-09-09 NOTE — Progress Notes (Signed)
CARE MANAGEMENT NOTE 09/09/2012  Patient:  KESLEY, GAFFEY   Account Number:  [id number]  Date Initiated:  09/09/2012  Documentation initiated by:  Vance Peper  Subjective/Objective Assessment:   78 yr old male s/p left total knee arthroplasty.     Action/Plan:   Progression of care and discharge planning. CM spoke with patient regarding home health and DME needs.RW,3in1 and CPM are at the home. Patient has family support.  Preopereratively setup with [facility], no changes.   Anticipated DC Date:  09/10/2012   Anticipated DC Plan:  HOME W HOME HEALTH SERVICES      DC Planning Services  CM consult      [facility] Choice  HOME HEALTH   Choice offered to / List presented to:  C-1 Patient        HH arranged  HH-2 PT      [facility] agency  [facility]   Status of service:  Completed, signed off Medicare Important Message given?   (If response is "NO", the following Medicare IM given date fields will be blank) Date Medicare IM given:   Date Additional Medicare IM given:    Discharge Disposition:  HOME W HOME HEALTH SERVICES  Per UR Regulation:    If discussed at Long Length of Stay Meetings, dates discussed:    Comments:

## 2012-09-09 NOTE — Progress Notes (Signed)
Occupational Therapy Discharge Patient Details Name: [name] MRN: [id number] DOB: Jun 07, 1934 Today's Date: 09/09/2012 Time: 1191-4782 OT Time Calculation (min): 41 min  Patient discharged from OT services secondary to pt is supervision to mod independence level for completing ADL's. Pt. wife able to (A) with any of pt needs once home. No further OT needs at this time.   Please see latest therapy progress note for current level of functioning and progress toward goals.    Progress and discharge plan discussed with patient and/or caregiver: Patient/Caregiver agrees with plan  GO     [name] 09/09/2012, 2:36 PM

## 2012-09-09 NOTE — Op Note (Signed)
TOTAL KNEE REPLACEMENT OPERATIVE NOTE:  09/08/2012  7:54 AM  PATIENT:  [name]  78 y.o. male  PRE-OPERATIVE DIAGNOSIS:  osteoarthritis left knee  POST-OPERATIVE DIAGNOSIS:  osteoarthritis left knee  PROCEDURE:  Procedure(s): TOTAL KNEE ARTHROPLASTY  SURGEON:  Surgeon(s): Raymon Mutton, MD  PHYSICIAN ASSISTANT: Altamese Cabal, [facility]  ANESTHESIA:   general  DRAINS: Hemovac and On-Q Marcaine Pain Pump  SPECIMEN: None  COUNTS:  Correct  TOURNIQUET:   Total Tourniquet Time Documented: Thigh (Left) - 51 minutes  DICTATION:  Indication for procedure:    The patient is a 78 y.o. male who has failed conservative treatment for osteoarthritis left knee.  Informed consent was obtained prior to anesthesia. The risks versus benefits of the operation were explain and in a way the patient can, and did, understand.   Description of procedure:     The patient was taken to the operating room and placed under anesthesia.  The patient was positioned in the usual fashion taking care that all body parts were adequately padded and/or protected.  I foley catheter was not placed.  A tourniquet was applied and the leg prepped and draped in the usual sterile fashion.  The extremity was exsanguinated with the esmarch and tourniquet inflated to 350 mmHg.  Pre-operative range of motion was normal.  The knee was in 5 degree of mild varus.  A midline incision approximately 6-7 inches long was made with a #10 blade.  A new blade was used to make a parapatellar arthrotomy going 2-3 cm into the quadriceps tendon, over the patella, and alongside the medial aspect of the patellar tendon.  A synovectomy was then performed with the #10 blade and forceps. I then elevated the deep MCL off the medial tibial metaphysis subperiosteally around to the semimembranosus attachment.    I everted the patella and used calipers to measure patellar thickness.  I used the reamer to ream down to appropriate thickness to  recreate the native thickness.  I then removed excess bone with the rongeur and sagittal saw.  I used the appropriately sized template and drilled the three lug holes.  I then put the trial in place and measured the thickness with the calipers to ensure recreation of the native thickness.  The trial was then removed and the patella subluxed and the knee brought into flexion.  A homan retractor was place to retract and protect the patella and lateral structures.  A Z-retractor was place medially to protect the medial structures.  The extra-medullary alignment system was used to make cut the tibial articular surface perpendicular to the anamotic axis of the tibia and in 3 degrees of posterior slope.  The cut surface and alignment jig was removed.  I then used the intramedullary alignment guide to make a 6 valgus cut on the distal femur.  I then marked out the epicondylar axis on the distal femur.  The posterior condylar axis measured 3 degrees.  I then used the anterior referencing sizer and measured the femur to be a size F.  The 4-In-1 cutting block was screwed into place in external rotation matching the posterior condylar angle, making our cuts perpendicular to the epicondylar axis.  Anterior, posterior and chamfer cuts were made with the sagittal saw.  The cutting block and cut pieces were removed.  A lamina spreader was placed in 90 degrees of flexion.  The ACL, PCL, menisci, and posterior condylar osteophytes were removed.  A 14 mm spacer blocked was found to offer good  flexion and extension gap balance after mild in degree releasing.   The scoop retractor was then placed and the femoral finishing block was pinned in place.  The small sagittal saw was used as well as the lug drill to finish the femur.  The block and cut surfaces were removed and the medullary canal hole filled with autograft bone from the cut pieces.  The tibia was delivered forward in deep flexion and external rotation.  A size 5 tray  was selected and pinned into place centered on the medial 1/3 of the tibial tubercle.  The reamer and keel was used to prepare the tibia through the tray.    I then trialed with the size F femur, size 5 tibia, a 14 mm insert and the 35 patella.  I had excellent flexion/extension gap balance, excellent patella tracking.  Flexion was full and beyond 120 degrees; extension was zero.  These components were chosen and the staff opened them to me on the back table while the knee was lavaged copiously and the cement mixed.  I cemented in the components and removed all excess cement.  The polyethylene tibial component was snapped into place and the knee placed in extension while cement was hardening.  The capsule was infilltrated with 20cc of .25% Marcaine with epinephrine.  A hemovac was place in the joint exiting superolaterally.  A pain pump was place superomedially superficial to the arthrotomy.  Once the cement was hard, the tourniquet was let down.  Hemostasis was obtained.  The arthrotomy was closed with figure-8 #1 vicryl sutures.  The deep soft tissues were closed with #0 vicryls and the subcuticular layer closed with a running #2-0 vicryl.  The skin was reapproximated and closed with skin staples.  The wound was dressed with xeroform, 4 x4's, 2 ABD sponges, a single layer of webril and a TED stocking.   The patient was then awakened, extubated, and taken to the recovery room in stable condition.  BLOOD LOSS:  300cc DRAINS: 1 hemovac, 1 pain catheter COMPLICATIONS:  None.  PLAN OF CARE: Admit to inpatient   PATIENT DISPOSITION:  PACU - hemodynamically stable.   Delay start of Pharmacological VTE agent (>24hrs) due to surgical blood loss or risk of bleeding:  not applicable  Please fax a copy of this op note to my office at [phone] (please only include page 1 and 2 of the Case Information op note)

## 2012-09-09 NOTE — Progress Notes (Signed)
Agree with PT treatment note.  Kathleen Tamm, PT DPT [phone]  

## 2012-09-09 NOTE — Progress Notes (Signed)
I agree with the following treatment note after reviewing documentation.   Johnston, Francely Craw Brynn   OTR/L Pager: [phone] Office: [phone] .   

## 2012-09-09 NOTE — Progress Notes (Signed)
I agree with the following treatment note after reviewing documentation.   Johnston, Kiarah Eckstein Brynn   OTR/L Pager: [phone] Office: [phone] .   

## 2012-09-09 NOTE — Progress Notes (Signed)
Agree with PT treatment note.  Broc Caspers, PT DPT [phone]  

## 2012-09-10 LAB — BASIC METABOLIC PANEL
CO2: 28 mEq/L (ref 19–32)
GFR calc non Af Amer: 83 mL/min — ABNORMAL LOW (ref >90)
Glucose, Bld: 111 mg/dL — ABNORMAL HIGH (ref 70–99)
Potassium: 3.8 mEq/L (ref 3.5–5.1)
Sodium: 139 mEq/L (ref 135–145)

## 2012-09-10 LAB — CBC
Hemoglobin: 12.6 g/dL — ABNORMAL LOW (ref 13.0–17.0)
MCHC: 33.7 g/dL (ref 30.0–36.0)
Platelets: 174 10*3/uL (ref 150–400)
RBC: 4.33 MIL/uL (ref 4.22–5.81)

## 2012-09-10 LAB — GLUCOSE, CAPILLARY
Glucose-Capillary: 114 mg/dL — ABNORMAL HIGH (ref 70–99)
Glucose-Capillary: 146 mg/dL — ABNORMAL HIGH (ref 70–99)

## 2012-09-10 MED ORDER — OXYCODONE HCL ER 10 MG PO T12A: 10.0000 mg | EXTENDED_RELEASE_TABLET | Freq: Two times a day (BID) | ORAL | Status: DC

## 2012-09-10 NOTE — Progress Notes (Signed)
Physical Therapy Treatment Patient Details Name: IAIN SAWCHUK MRN: [id number] DOB: 1934-08-31 Today's Date: 09/10/2012 Time: 0865-7846 PT Time Calculation (min): 37 min  PT Assessment / Plan / Recommendation Comments on Treatment Session  Pt able to increase efficiency with ambulation.  Pt able to recognize errors during step sequence and able to properly correct the errors.  Plan for next session is to review HEP and educate on home management.    Follow Up Recommendations  Home health PT;Supervision - Intermittent     Does the patient have the potential to tolerate intense rehabilitation     Barriers to Discharge        Equipment Recommendations  None recommended by PT    Recommendations for Other Services Other (comment) (None)  Frequency 7X/week   Plan Discharge plan remains appropriate;Frequency remains appropriate    Precautions / Restrictions Precautions Precautions: Knee Restrictions Weight Bearing Restrictions: Yes LLE Weight Bearing: Weight bearing as tolerated   Pertinent Vitals/Pain Did not rate pain but c/o heels hurting due to hanging off chair.  With positioning pt stated feeling better.    Mobility  Bed Mobility Bed Mobility: Not assessed Transfers Transfers: Sit to Stand;Stand to Sit Sit to Stand: 5: Supervision;With upper extremity assist;From bed;From toilet Stand to Sit: 5: Supervision;With upper extremity assist;To chair/3-in-1;To toilet Details for Transfer Assistance: Supervision for safety. Ambulation/Gait Ambulation/Gait Assistance: 4: Min assist Ambulation Distance (Feet): 115 Feet Assistive device: Rolling walker Ambulation/Gait Assistance Details: (A) with RW placement.  Cues for proper step sequence, posture, and safety awareness. Gait Pattern: Step-to pattern;Decreased stride length;Antalgic;Trunk flexed Gait velocity: decreased Stairs: No Wheelchair Mobility Wheelchair Mobility: No    Exercises     PT Diagnosis:    PT Problem  List:   PT Treatment Interventions:     PT Goals Acute Rehab PT Goals PT Goal Formulation: With patient Time For Goal Achievement: 09/15/12 Potential to Achieve Goals: Good Pt will go Supine/Side to Sit: with modified independence PT Goal: Supine/Side to Sit - Progress: Progressing toward goal Pt will go Sit to Stand: with modified independence;with upper extremity assist PT Goal: Sit to Stand - Progress: Progressing toward goal Pt will go Stand to Sit: with modified independence;with upper extremity assist PT Goal: Stand to Sit - Progress: Progressing toward goal Pt will Ambulate: >150 feet;with modified independence;with rolling walker PT Goal: Ambulate - Progress: Progressing toward goal  Visit Information  Last PT Received On: 09/10/12 Assistance Needed: +1    Subjective Data  Subjective: Really need to use the restroom. Patient Stated Goal: To get better   Cognition  Overall Cognitive Status: Appears within functional limits for tasks assessed/performed Arousal/Alertness: Awake/alert Orientation Level: Appears intact for tasks assessed Behavior During Session: [facility] for tasks performed    Balance  Balance Balance Assessed: No  End of Session PT - End of Session Equipment Utilized During Treatment: Gait belt Activity Tolerance: Patient tolerated treatment well Patient left: in chair;with call bell/phone within reach Nurse Communication: Mobility status   GP     Bayli Quesinberry 09/10/2012, 8:52 AM

## 2012-09-10 NOTE — Discharge Summary (Signed)
[facility] Georgena Spurling, MD   Altamese Cabal, PA-C [address] [place], [place], [place]  [address]                             [phone]  PATIENT ID: [name]        MRN:  [id number]          DOB/AGE: Jun 27, 1934 / 78 y.o.    DISCHARGE SUMMARY  ADMISSION DATE:    09/08/2012 DISCHARGE DATE:   09/10/2012   ADMISSION DIAGNOSIS: osteoarthritis left knee    DISCHARGE DIAGNOSIS:  osteoarthritis left knee    ADDITIONAL DIAGNOSIS: Active Problems:  * No active hospital problems. *   Past Medical History  Diagnosis Date  . Myocardial infarction     1990  . Hypertension   . Diabetes mellitus   . GERD (gastroesophageal reflux disease)   . Neuromuscular disorder     neuropathy  . 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 Arthritis   . Hyperlipidemia     PROCEDURE: Procedure(s): TOTAL KNEE ARTHROPLASTY on 09/08/2012  CONSULTS:     HISTORY:  See H&P in chart  HOSPITAL COURSE:  [name] is a 78 y.o. admitted on 09/08/2012 and found to have a diagnosis of osteoarthritis left knee.  After appropriate laboratory studies were obtained  they were taken to the operating room on 09/08/2012 and underwent Procedure(s): TOTAL KNEE ARTHROPLASTY.   They were given perioperative antibiotics:  Anti-infectives     Start     Dose/Rate Route Frequency Ordered Stop   09/08/12 1400   ceFAZolin (ANCEF) IVPB 2 g/50 mL premix        2 g 100 mL/hr over 30 Minutes Intravenous Every 6 hours 09/08/12 1101 09/08/12 2200   09/07/12 0923   ceFAZolin (ANCEF) IVPB 2 g/50 mL premix        2 g 100 mL/hr over 30 Minutes Intravenous 60 min pre-op 09/07/12 0923 09/08/12 0735        .  Tolerated the procedure well.  Placed with a foley intraoperatively.  Given Ofirmev at induction and for 48 hours.    POD #1, allowed out of bed to a chair.  PT for ambulation and exercise program.  Foley D/C'd in morning.  IV saline locked.  O2 discontionued.  POD #2, continued PT and ambulation.    Hemovac pulled. .  The remainder of the hospital course was dedicated to ambulation and strengthening.   The patient was discharged on 2 Days Post-Op in  Good condition.  Blood products given:none  DIAGNOSTIC STUDIES: Recent vital signs: Patient Vitals for the past 24 hrs:  BP Temp Pulse Resp SpO2  09/10/12 0524 118/63 mmHg 97.6 F (36.4 C) 90  18  98 %  09/10/12 0400 - - - 18  98 %  09/10/12 0000 - - - 18  97 %  2012-09-19 2127 121/54 mmHg 98.9 F (37.2 C) 92  18  97 %  09/19/2012 2000 - - - 18  97 %  September 19, 2012 1600 - - - 18  96 %  Sep 19, 2012 1400 132/56 mmHg 98.5 F (36.9 C) 88  16  97 %  09-19-2012 1200 - - - 18  -       Recent laboratory studies:  Basename 09/10/12 0601 Sep 19, 2012 0605  WBC 12.0* 9.2  HGB 12.6* 12.1*  HCT 37.4* 36.5*  PLT 174 140*    Basename 09/10/12 0601  09/09/12 0605  NA 139 135  K 3.8 3.4*  CL 104 100  CO2 28 29  BUN 10 13  CREATININE 0.83 0.87  GLUCOSE 111* 146*  CALCIUM 8.3* 7.9*   Lab Results  Component Value Date   INR 1.02 09/01/2012   INR 1.0 09/29/2008     Recent Radiographic Studies :  Dg Chest 2 View  09/01/2012  *RADIOLOGY REPORT*  Clinical Data: Preoperative for left total knee arthroplasty. Prior myocardial infarction, hypertension, diabetes.  CHEST - 2 VIEW  Comparison: 09/29/2008  Findings: Thoracic spondylosis noted.  Lower cervical plate screw fixator observed.  Mild atherosclerotic calcification of the aortic arch is present.  Cardiac and mediastinal contours appear unremarkable.  The lungs appear clear.  No pleural effusion noted.  IMPRESSION: 1.  Thoracic spondylosis. 2.  Mild aortic atherosclerosis.   Original Report Authenticated By: Dellia Cloud, M.D.     DISCHARGE INSTRUCTIONS: Discharge Orders    Future Orders Please Complete By Expires   Diet - low sodium heart healthy      Call MD / Call 911      Comments:   If you experience chest pain or shortness of breath, CALL 911 and be transported to the hospital emergency  room.  If you develope a fever above 101 F, pus (white drainage) or increased drainage or redness at the wound, or calf pain, call your surgeon's office.   Constipation Prevention      Comments:   Drink plenty of fluids.  Prune juice may be helpful.  You may use a stool softener, such as Colace (over the counter) 100 mg twice a day.  Use MiraLax (over the counter) for constipation as needed.   Increase activity slowly as tolerated      Driving restrictions      Comments:   No driving for 6 weeks   Lifting restrictions      Comments:   No lifting for 6 weeks   CPM      Comments:   Continuous passive motion machine (CPM):      Use the CPM from 0 to 90 for 6-8 hours per day.      You may increase by 10 per day.  You may break it up into 2 or 3 sessions per day.      Use CPM for 2 weeks or until you are told to stop.   TED hose      Comments:   Use stockings (TED hose) for 3 weeks on both leg(s).  You may remove them at night for sleeping.   Change dressing      Comments:   Change dressing on thursday, then change the dressing daily with sterile 4 x 4 inch gauze dressing and apply TED hose.   Do not put a pillow under the knee. Place it under the heel.         DISCHARGE MEDICATIONS:     Medication List     As of 09/10/2012  9:07 AM    STOP taking these medications         aspirin EC 81 MG tablet      Fish Oil 500 MG Caps      oxycodone-acetaminophen 5-500 MG per tablet   Commonly known as: ROXICET      TAKE these medications         CALCIUM 500 + D PO   Take 1 tablet by mouth 2 (two) times daily with a meal.  celecoxib 200 MG capsule   Commonly known as: CELEBREX   Take 1 capsule (200 mg total) by mouth every 12 (twelve) hours.      clonazePAM 1 MG tablet   Commonly known as: KLONOPIN   Take 0.5 mg by mouth at bedtime.      enoxaparin 40 MG/0.4ML injection   Commonly known as: LOVENOX   Inject 0.4 mLs (40 mg total) into the skin daily.      gabapentin 600  MG tablet   Commonly known as: NEURONTIN   Take 600 mg by mouth daily.      glipiZIDE 5 MG 24 hr tablet   Commonly known as: GLUCOTROL XL   Take 5 mg by mouth daily with breakfast.      methocarbamol 500 MG tablet   Commonly known as: ROBAXIN   Take 1 tablet (500 mg total) by mouth every 6 (six) hours as needed.      metolazone 2.5 MG tablet   Commonly known as: ZAROXOLYN   Take 2.5 mg by mouth every other day as needed. For swelling in  legs      metoprolol succinate 50 MG 24 hr tablet   Commonly known as: TOPROL-XL   Take 50 mg by mouth daily. Take with or immediately following a meal.      omeprazole 20 MG capsule   Commonly known as: PRILOSEC   Take 20 mg by mouth daily.      oxyCODONE 5 MG immediate release tablet   Commonly known as: Oxy IR/ROXICODONE   Take 1-2 tablets (5-10 mg total) by mouth every 4 (four) hours as needed.      oxyCODONE 10 MG 12 hr tablet   Commonly known as: OXYCONTIN   Take 1 tablet (10 mg total) by mouth every 12 (twelve) hours.      potassium chloride SA 20 MEQ tablet   Commonly known as: K-DUR,KLOR-CON   Take 20 mEq by mouth 2 (two) times daily.      pravastatin 40 MG tablet   Commonly known as: PRAVACHOL   Take 20 mg by mouth daily.      quinapril 20 MG tablet   Commonly known as: ACCUPRIL   Take 20 mg by mouth daily.      torsemide 20 MG tablet   Commonly known as: DEMADEX   Take 40 mg by mouth 2 (two) times daily with breakfast and lunch.        FOLLOW UP VISIT:       Follow-up Information    Follow up with Raymon Mutton, MD. Call on 09/23/2012.   Contact information:   [address] [place] [address] [phone]          DISPOSITION:  home    CONDITION:  {Good  Edin Skarda 09/10/2012, 9:07 AM

## 2012-09-10 NOTE — Progress Notes (Signed)
Physical Therapy Progress Note   09/10/12 1300  PT Visit Information  Last PT Received On 09/10/12  Assistance Needed +1  PT Time Calculation  PT Start Time 1143  PT Stop Time 1203  PT Time Calculation (min) 20 min  Subjective Data  Subjective Ready to go home  Patient Stated Goal To go home today  Precautions  Precautions Knee  Restrictions  Weight Bearing Restrictions Yes  LLE Weight Bearing WBAT  Cognition  Overall Cognitive Status Appears within functional limits for tasks assessed/performed  Arousal/Alertness Awake/alert  Orientation Level Appears intact for tasks assessed  Behavior During Session [facility] for tasks performed  Bed Mobility  Bed Mobility Not assessed  Transfers  Transfers Not assessed  Ambulation/Gait  Ambulation/Gait Assistance Not tested (comment)  Stairs No  Wheelchair Mobility  Wheelchair Mobility No  Balance  Balance Assessed No  Exercises  Exercises Total Joint  Total Joint Exercises  Quad Sets AROM;Left;10 reps  Short Arc Quad AROM;Left;5 reps  Heel Slides AROM;Left;5 reps  Hip ABduction/ADduction AROM;Left;5 reps  Straight Leg Raises AAROM;Left;5 reps  Goniometric ROM 20-97  PT - End of Session  Activity Tolerance Patient tolerated treatment well  Patient left in chair;with call bell/phone within reach;with family/visitor present  Nurse Communication Other (comment);Mobility status (d/c status)  PT - Assessment/Plan  Comments on Treatment Session Pt encouraged to ask questions about home management and was educated on how to enter and exit the car for ride home.  Pt stated feeling comfortable with stairs and exercises and was instructed to perform the HEP 3 times a day.  Pt is ready for d/c home when deemed medically safe.  PT Plan Discharge plan remains appropriate;Frequency remains appropriate  PT Frequency 7X/week  Recommendations for Other Services Other (comment) (None)  Follow Up Recommendations Home health PT;Supervision -  Intermittent  Equipment Recommended None recommended by PT  Acute Rehab PT Goals  PT Goal Formulation With patient  Time For Goal Achievement 09/15/12  Potential to Achieve Goals Good  Pt will Perform Home Exercise Program Independently  PT Goal: Perform Home Exercise Program - Progress Progressing toward goal    Pt rate pain in L LE 3/10.  Ahmere Hemenway, SPT

## 2012-09-10 NOTE — Progress Notes (Signed)
SPORTS MEDICINE AND JOINT REPLACEMENT  Georgena Spurling, MD   Altamese Cabal, PA-C [address] [place], [place], [place]  [address]                             [phone]   PROGRESS NOTE  Subjective:  negative for Chest Pain  negative for Shortness of Breath  negative for Nausea/Vomiting   negative for Calf Pain  negative for Bowel Movement   Tolerating Diet: yes         Patient reports pain as 5 on 0-10 scale.    Objective: Vital signs in last 24 hours:   Patient Vitals for the past 24 hrs:  BP Temp Pulse Resp SpO2  09/10/12 0524 118/63 mmHg 97.6 F (36.4 C) 90  18  98 %  09/10/12 0400 - - - 18  98 %  09/10/12 0000 - - - 18  97 %  09/09/12 2127 121/54 mmHg 98.9 F (37.2 C) 92  18  97 %  09/09/12 2000 - - - 18  97 %  09/09/12 1600 - - - 18  96 %  09/09/12 1400 132/56 mmHg 98.5 F (36.9 C) 88  16  97 %  09/09/12 1200 - - - 18  -    @flow {1959:LAST@   Intake/Output from previous day:       Intake/Output this shift:       Intake/Output      10/15 0701 - 10/16 0700 10/16 0701 - 10/17 0700   I.V. (mL/kg)     IV Piggyback     Total Intake(mL/kg)     Urine (mL/kg/hr)     Drains     Total Output     Net             LABORATORY DATA:  Basename 09/10/12 0601 09/09/12 0605  WBC 12.0* 9.2  HGB 12.6* 12.1*  HCT 37.4* 36.5*  PLT 174 140*    Basename 09/10/12 0601 09/09/12 0605  NA 139 135  K 3.8 3.4*  CL 104 100  CO2 28 29  BUN 10 13  CREATININE 0.83 0.87  GLUCOSE 111* 146*  CALCIUM 8.3* 7.9*   Lab Results  Component Value Date   INR 1.02 09/01/2012   INR 1.0 09/29/2008    Examination:  General appearance: alert, cooperative and no distress Extremities: Homans sign is negative, no sign of DVT  Wound Exam: clean, dry, intact   Drainage:  None: wound tissue dry  Motor Exam: EHL and FHL Intact  Sensory Exam: Deep Peroneal normal  Vascular Exam:    Assessment:    2 Days Post-Op  Procedure(s) (LRB): TOTAL KNEE ARTHROPLASTY (Left)  ADDITIONAL  DIAGNOSIS:  Active Problems:  * No active hospital problems. *   Acute Blood Loss Anemia   Plan: Physical Therapy as ordered Weight Bearing as Tolerated (WBAT)  DVT Prophylaxis:  Lovenox  DISCHARGE PLAN: Home  DISCHARGE NEEDS: HHPT, CPM, Walker and 3-in-1 comode seat         Lorayne Getchell 09/10/2012, 9:01 AM

## 2012-09-10 NOTE — Progress Notes (Signed)
Received order to discharge patient to home. Reviewed discharge instructions with patient and patient's wife. Reviewed diet, pain, activity, medications, appointments, when to call MD/911, signs and symptoms of infection, constipations instructions and wound care. Patient's wife verbalized how to administer Lovenox shots and said she has experience with giving Lovenox shots. She also states patient has CPM and walker at home and was instructed in the use of both. Patient sent home with prescriptions and copy of discharge instructions.

## 2012-09-10 NOTE — Progress Notes (Signed)
Agree with PT treatment note.  Ceceilia Cephus, PT DPT [phone]  

## 2012-09-10 NOTE — Progress Notes (Signed)
Agree with PT treatment note.  Dominyk Law, PT DPT [phone]  

## 2013-04-14 ENCOUNTER — Encounter: Payer: Self-pay | Admitting: Emergency Medicine

## 2013-04-16 ENCOUNTER — Ambulatory Visit (INDEPENDENT_AMBULATORY_CARE_PROVIDER_SITE_OTHER): Payer: Medicare Other | Admitting: Cardiovascular Disease

## 2013-04-16 ENCOUNTER — Encounter: Payer: Self-pay | Admitting: Cardiovascular Disease

## 2013-04-16 VITALS — BP 120/60 | Ht 70.0 in | Wt 205.6 lb

## 2013-04-16 DIAGNOSIS — E785 Hyperlipidemia, unspecified: Secondary | ICD-10-CM

## 2013-04-16 DIAGNOSIS — M109 Gout, unspecified: Secondary | ICD-10-CM | POA: Insufficient documentation

## 2013-04-16 DIAGNOSIS — Q752 Hypertelorism: Secondary | ICD-10-CM | POA: Insufficient documentation

## 2013-04-16 DIAGNOSIS — I2510 Atherosclerotic heart disease of native coronary artery without angina pectoris: Secondary | ICD-10-CM

## 2013-04-16 DIAGNOSIS — I119 Hypertensive heart disease without heart failure: Secondary | ICD-10-CM

## 2013-04-16 DIAGNOSIS — E119 Type 2 diabetes mellitus without complications: Secondary | ICD-10-CM | POA: Insufficient documentation

## 2013-04-16 DIAGNOSIS — Q759 Congenital malformation of skull and face bones, unspecified: Secondary | ICD-10-CM

## 2013-04-16 NOTE — Patient Instructions (Signed)
Your physician recommends that you schedule a follow-up appointment in: 6 months.  No changes have been made today. 

## 2013-04-16 NOTE — Progress Notes (Signed)
Patient ID: [name], male   DOB: 27-Jun-1934, 79 y.o.   MRN: [id number]  HPI: [name], is a 79 y.o. male who presents today for six-month followup cardiologic evaluation. [name] is now 79 years old. In 2006 he was found to have mild coronary artery disease for which she's been on medical therapy. Additional problems include hypertension, type II diabetic diabetes mellitus, hyperlipidemia, as well as peripheral neuropathy. He's had documented atrial bigeminal rhythm in the past and this has improved with beta blocker therapy. He does have chronic right bundle branch block his last 2-D echo Doppler study in September 2013 showed mild concentric LVH with normal systolic function with ejection fraction greater than 55%. He did have mild left atrial dilatation, mild to moderate mitral annular calcification with trace MR, mild TR, and very mild elevation of artery systolic pressure 32 mm. There was mild aortic sclerosis without stenosis with mild aortic insufficiency and mild pulmonic insufficiency.  I last saw [name] he was in October 2013 prior to undergoing left knee replacement surgery he was able to tolerate surgery well from a cardiovascular standpoint. Additionally, he has a history of gout. He saw Dr. Mauricio Po for primary care the presents now for six-month chronologic assessment. He presently denies any chest tightness or shortness of breath. At times he does note some mild leg swelling intermittently. He states several months ago he did have an episode of possible gout in his feet.  Past Medical History  Diagnosis Date  . Myocardial infarction     1990  . Hypertension   . Diabetes mellitus   . GERD (gastroesophageal reflux disease)   . Neuromuscular disorder     neuropathy  . 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 Arthritis   . Hyperlipidemia     Past Surgical History  Procedure Laterality Date  . Joint replacement      right knee  . Skin cancer  excision      eyelid and melonoma on nose  . Cervical laminectomy    . Rotator cuff repair    . Back surgery    . Prostate surgery    . Total knee arthroplasty  09/08/2012    Procedure: TOTAL KNEE ARTHROPLASTY;  Surgeon: Raymon Mutton, MD;  Location: [facility] OR;  Service: Orthopedics;  Laterality: Left;  left total knee arthroplasty    No Known Allergies  Current Outpatient Prescriptions  Medication Sig Dispense Refill  . aspirin 81 MG tablet Take 81 mg by mouth daily.      Marland Kitchen gabapentin (NEURONTIN) 600 MG tablet Take 600 mg by mouth. 2 TABLETS DAILY      . glipiZIDE (GLUCOTROL XL) 5 MG 24 hr tablet Take 5 mg by mouth daily with breakfast.      . metolazone (ZAROXOLYN) 2.5 MG tablet Take 2.5 mg by mouth every other day as needed. For swelling in  legs      . metoprolol succinate (TOPROL-XL) 50 MG 24 hr tablet Take 50 mg by mouth daily. Take with or immediately following a meal.      . oxyCODONE (OXY IR/ROXICODONE) 5 MG immediate release tablet Take 1-2 tablets (5-10 mg total) by mouth every 4 (four) hours as needed.  90 tablet  0  . potassium chloride SA (K-DUR,KLOR-CON) 20 MEQ tablet Take 20 mEq by mouth 2 (two) times daily.      . pravastatin (PRAVACHOL) 40 MG tablet Take 40 mg by mouth daily.       Marland Kitchen  quinapril (ACCUPRIL) 20 MG tablet Take 20 mg by mouth daily.      Marland Kitchen torsemide (DEMADEX) 20 MG tablet Take 40 mg by mouth 2 (two) times daily with breakfast and lunch.      . Calcium Carbonate-Vitamin D (CALCIUM 500 + D PO) Take 1 tablet by mouth 2 (two) times daily with a meal.      . celecoxib (CELEBREX) 200 MG capsule Take 1 capsule (200 mg total) by mouth every 12 (twelve) hours.  30 capsule  0  . clonazePAM (KLONOPIN) 1 MG tablet Take 0.5 mg by mouth at bedtime.      . enoxaparin (LOVENOX) 40 MG/0.4ML injection Inject 0.4 mLs (40 mg total) into the skin daily.  12 Syringe  0  . methocarbamol (ROBAXIN) 500 MG tablet Take 1 tablet (500 mg total) by mouth every 6 (six) hours as needed.  60  tablet  0  . omeprazole (PRILOSEC) 20 MG capsule Take 20 mg by mouth daily.      Marland Kitchen oxyCODONE (OXYCONTIN) 10 MG 12 hr tablet Take 1 tablet (10 mg total) by mouth every 12 (twelve) hours.  30 tablet  0   No current facility-administered medications for this visit.    Socially he is married and has 4 children 6 grandchildren 5 great-grandchildren. There is no tobacco or alcohol use. He does not routinely exercise.  ROV is negative for fever chills night sweats. Does note significant improvement in his left knee Alling replacement surgery. Remotely he did have a right knee replacement he feels his right knee bothers him more than his left presently. He denies tachycardia palpitations or awareness of arrhythmia. He denies wheezing. He denies chest pressure. He denies bleeding. He does note some occasional leg swelling left leg greater than right. There is a history of gout the denies paresthesias. Other system review is negative.  PE BP 120/60  Ht 5\' 10"  (1.778 m)  Wt 205 lb 9.6 oz (93.26 kg)  BMI 29.5 kg/m2  General: Alert, oriented, no distress.  HEENT: Normocephalic, atraumatic. Pupils round and reactive; sclera anicteric;  Nose with nasal septal hypertrophy Mouth/Parynx benign; Mallinpatti scale 3 Neck: No JVD, no carotid briuts Lungs: clear to ausculatation and percussion; no wheezing or rales Heart: RRR, s1 s2 normal; 1/6 SEM Abdomen: soft, nontender; no hepatosplenomehaly, BS+; abdominal aorta nontender and not dilated by palpation. Mild diastasis recti Pulses 2+ Extremities: no clubbinbg cyanosis or edema, Homan's sign negative  Neurologic: grossly nonfocal   ECG; NSR, RBBB with repolarization changes  LABS:  BMET    Component Value Date/Time   NA 139 09/10/2012 0601   K 3.8 09/10/2012 0601   CL 104 09/10/2012 0601   CO2 28 09/10/2012 0601   GLUCOSE 111* 09/10/2012 0601   BUN 10 09/10/2012 0601   CREATININE 0.83 09/10/2012 0601   CALCIUM 8.3* 09/10/2012 0601   GFRNONAA  83* 09/10/2012 0601   GFRAA >90 09/10/2012 0601     Hepatic Function Panel     Component Value Date/Time   PROT 6.6 09/01/2012 1027   ALBUMIN 3.6 09/01/2012 1027   AST 23 09/01/2012 1027   ALT 24 09/01/2012 1027   ALKPHOS 64 09/01/2012 1027   BILITOT 0.7 09/01/2012 1027     CBC    Component Value Date/Time   WBC 12.0* 09/10/2012 0601   RBC 4.33 09/10/2012 0601   HGB 12.6* 09/10/2012 0601   HCT 37.4* 09/10/2012 0601   PLT 174 09/10/2012 0601   MCV 86.4 09/10/2012 0601  MCH 29.1 09/10/2012 0601   MCHC 33.7 09/10/2012 0601   RDW 15.2 09/10/2012 0601   LYMPHSABS 1.9 09/29/2008 1419   MONOABS 0.6 09/29/2008 1419   EOSABS 0.1 09/29/2008 1419   BASOSABS 0.0 09/29/2008 1419     BNP No results found for this basename: probnp    Lipid Panel  No results found for this basename: chol, trig, hdl, cholhdl, vldl, ldlcalc     RADIOLOGY: No results found.    ASSESSMENT AND PLAN:  From a cardiac perspective, [name] is remaining fairly stable. He seems to tolerate torsemide with reference to significant improvement in previous leg swelling. His blood pressure today is well controlled. He does have chronic right bundle branch block. He no longer is having ectopy. He tolerated his left knee replacement surgery well from a cardiovascular standpoint.  I did review his laboratory from [facility] . Most recent cholesterol was 106 triglycerides 39 HDL 48 LDL 50 01/29/2013. BUN 17 creatinine 0.83. Vitamin D level was 31. TSH normal at 2.2. Free T4-1 0.43. Uric acid was increased at 10.6 and hemoglobin A1c was 6.1. We did his diabetes.  He has lost 8 pounds since his last office visit. Presently I have not recommended any additional medication changes. I will see him back in the office in 6 months for followup or sooner if problems arise.      Harlie Ragle A 04/16/2013 12:38 PM

## 2013-04-17 ENCOUNTER — Other Ambulatory Visit: Payer: Self-pay | Admitting: *Deleted

## 2013-04-17 MED ORDER — TORSEMIDE 20 MG PO TABS: 40.0000 mg | ORAL_TABLET | Freq: Two times a day (BID) | ORAL | Status: DC

## 2013-07-28 ENCOUNTER — Other Ambulatory Visit: Payer: Self-pay | Admitting: *Deleted

## 2013-07-28 MED ORDER — METOPROLOL SUCCINATE ER 50 MG PO TB24: 50.0000 mg | ORAL_TABLET | Freq: Every day | ORAL | Status: DC

## 2013-07-28 NOTE — Telephone Encounter (Signed)
Rx was sent to pharmacy electronically. 

## 2013-09-04 ENCOUNTER — Other Ambulatory Visit: Payer: Self-pay | Admitting: *Deleted

## 2013-09-04 MED ORDER — POTASSIUM CHLORIDE CRYS ER 20 MEQ PO TBCR: 20.0000 meq | EXTENDED_RELEASE_TABLET | Freq: Two times a day (BID) | ORAL | Status: DC

## 2013-10-14 ENCOUNTER — Encounter: Payer: Self-pay | Admitting: Cardiovascular Disease

## 2013-10-14 ENCOUNTER — Ambulatory Visit (INDEPENDENT_AMBULATORY_CARE_PROVIDER_SITE_OTHER): Payer: Medicare Other | Admitting: Cardiovascular Disease

## 2013-10-14 VITALS — BP 130/60 | HR 83 | Ht 68.0 in | Wt 220.5 lb

## 2013-10-14 DIAGNOSIS — Q752 Hypertelorism: Secondary | ICD-10-CM

## 2013-10-14 DIAGNOSIS — I4510 Unspecified right bundle-branch block: Secondary | ICD-10-CM | POA: Insufficient documentation

## 2013-10-14 DIAGNOSIS — M1990 Unspecified osteoarthritis, unspecified site: Secondary | ICD-10-CM

## 2013-10-14 DIAGNOSIS — I2510 Atherosclerotic heart disease of native coronary artery without angina pectoris: Secondary | ICD-10-CM

## 2013-10-14 DIAGNOSIS — R600 Localized edema: Secondary | ICD-10-CM | POA: Insufficient documentation

## 2013-10-14 DIAGNOSIS — E119 Type 2 diabetes mellitus without complications: Secondary | ICD-10-CM

## 2013-10-14 DIAGNOSIS — Q759 Congenital malformation of skull and face bones, unspecified: Secondary | ICD-10-CM

## 2013-10-14 DIAGNOSIS — R609 Edema, unspecified: Secondary | ICD-10-CM

## 2013-10-14 DIAGNOSIS — M129 Arthropathy, unspecified: Secondary | ICD-10-CM

## 2013-10-14 DIAGNOSIS — E785 Hyperlipidemia, unspecified: Secondary | ICD-10-CM

## 2013-10-14 DIAGNOSIS — Z79899 Other long term (current) drug therapy: Secondary | ICD-10-CM

## 2013-10-14 DIAGNOSIS — R5381 Other malaise: Secondary | ICD-10-CM

## 2013-10-14 MED ORDER — TORSEMIDE 20 MG PO TABS: ORAL_TABLET | ORAL | Status: DC

## 2013-10-14 NOTE — Patient Instructions (Addendum)
Your physician recommends that you return for lab work in: 1-2 weeks.  Your physician recommends that you schedule a follow-up appointment in: 6 MONTHS.  Your physician has recommended you make the following change in your medication: Torsemide 20 mg 2 tablets in the morning (40 mg) and 1 tablet in the evening (20mg ).

## 2013-10-14 NOTE — Progress Notes (Signed)
Patient ID: [name], male   DOB: 21-Mar-1934, 79 y.o.   MRN: [id number]     HPI: [name] is a 79 y.o. male who presents to the office today for one-year cardiology evaluation.  [name] is now 79 years old. He has documented mild coronary artery disease by catheterization in 2006 which she's being treated medically. Additional problems include hypertension, type 2 diabetes mellitus, hyperlipidemia, peripheral neuropathy, and when I last saw him in October 2013 I gave him clearance to undergo left knee replacement surgery. He apparently tolerated that surgery well without cardiovascular compromise. An echo Doppler study at that time showed an EF of 55% with mild LVH, mild LA dilatation, mild to moderate mitral annular calcification trace MR, and mild pulmonary hypertension with mild TR with estimated pressure 32 mm. There was aortic sclerosis without stenosis with mild aortic insufficiency and mild pulmonic insufficiency.  Over the past year, [name] has been without chest pain. Unfortunately his wife passed away. He does note lower shimmy edema despite taking torsemide 20 mg twice a day as well as his ACE inhibitor with low-dose HCTZ. He has arthritis and has been taking Celebrex daily at 200 mg which may be also contributed to some of his leg swelling. He is unaware of palpitations. He denies chest pressure. Primary physician is Dr. Elyn Peers in [place].  Past Medical History  Diagnosis Date  . Myocardial infarction     1990  . Hypertension   . Diabetes mellitus   . GERD (gastroesophageal reflux disease)   . Neuromuscular disorder     neuropathy  . 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 Arthritis   . Hyperlipidemia     Past Surgical History  Procedure Laterality Date  . Joint replacement      right knee  . Skin cancer excision      eyelid and melonoma on nose  . Cervical laminectomy    . Rotator cuff repair    . Back surgery    . Prostate surgery    .  Total knee arthroplasty  09/08/2012    Procedure: TOTAL KNEE ARTHROPLASTY;  Surgeon: Raymon Mutton, MD;  Location: [facility] OR;  Service: Orthopedics;  Laterality: Left;  left total knee arthroplasty    No Known Allergies  Current Outpatient Prescriptions  Medication Sig Dispense Refill  . allopurinol (ZYLOPRIM) 300 MG tablet Take 1 tablet by mouth daily.      Marland Kitchen aspirin 81 MG tablet Take 81 mg by mouth daily.      . Calcium Carbonate-Vitamin D (CALCIUM 500 + D PO) Take 1 tablet by mouth 2 (two) times daily with a meal.      . celecoxib (CELEBREX) 200 MG capsule Take 200 mg by mouth daily.      Marland Kitchen escitalopram (LEXAPRO) 5 MG tablet Take 1 tablet by mouth daily.      Marland Kitchen gabapentin (NEURONTIN) 600 MG tablet Take 600 mg by mouth. 2 TABLETS DAILY      . glipiZIDE (GLUCOTROL XL) 5 MG 24 hr tablet Take 5 mg by mouth daily with breakfast.      . metoprolol succinate (TOPROL-XL) 50 MG 24 hr tablet Take 1 tablet (50 mg total) by mouth daily. Take with or immediately following a meal.  30 tablet  8  . omeprazole (PRILOSEC) 20 MG capsule Take 20 mg by mouth daily.      Marland Kitchen oxyCODONE (OXY IR/ROXICODONE) 5 MG immediate release tablet Take 1-2 tablets (  5-10 mg total) by mouth every 4 (four) hours as needed.  90 tablet  0  . potassium chloride SA (K-DUR,KLOR-CON) 20 MEQ tablet Take 1 tablet (20 mEq total) by mouth 2 (two) times daily.  60 tablet  2  . pravastatin (PRAVACHOL) 40 MG tablet Take 40 mg by mouth daily.       . quinapril-hydrochlorothiazide (ACCURETIC) 20-25 MG per tablet Take 1 tablet by mouth daily.      Marland Kitchen torsemide (DEMADEX) 20 MG tablet Take 40 mg by mouth. 2 tabs in the morning and 1 in the afternoon       No current facility-administered medications for this visit.    History   Social History  . Marital Status: Married    Spouse Name: N/A    Number of Children: N/A  . Years of Education: N/A   Occupational History  . Not on file.   Social History Main Topics  . Smoking status: Former  Smoker -- 1.00 packs/day for 12 years    Types: Cigarettes  . Smokeless tobacco: Former Neurosurgeon    Types: Chew  . Alcohol Use: No  . Drug Use: No  . Sexual Activity: Not on file   Other Topics Concern  . Not on file   Social History Narrative  . No narrative on file   Socially, he is widowed for one year. There is no tobacco history or alcohol. He completed 10th grade education. He is retired per he does use a cane.  History reviewed. No pertinent family history.  ROS is negative for fevers, chills or night sweats. He denies rash or skin lesions. He denies visual changes. There is no PND orthopnea. He denies palpitations. Denies cough or increased sputum production or wheezing. He denies anginal symptoms. He denies change in bowel or bladder habits. There is no blood in the stool or urine. He does note leg swelling bilaterally despite taking his diuretics. He does have peripheral neuropathy. He does have diabetes mellitus and takes Glucotrol. He does have hyperlipidemia on statin therapy which he tolerates. He denies any thyroid issues.  Other comprehensive 12 point system review is negative.  PE BP 130/60  Pulse 83  Ht 5\' 8"  (1.727 m)  Wt 220 lb 8 oz (100.018 kg)  BMI 33.53 kg/m2  General: Alert, oriented, no distress.  Skin: normal turgor, no rashes HEENT: Normocephalic, atraumatic. Pupils round and reactive; sclera anicteric;no lid lag.  Nose without nasal septal hypertrophy Mouth/Parynx benign; Mallinpatti scale 3 Neck: No JVD, no carotid briuts Lungs: clear to ausculatation and percussion; no wheezing or rales Heart: RRR, s1 s2 normal over 6 systolic murmur Abdomen: soft, nontender; no hepatosplenomehaly, BS+; abdominal aorta nontender and not dilated by palpation. Pulses 2+ Extremities: One to 2+ bilateral lower extremity edema pretibially distally; no clubbing cyanosis, Homan's sign negative  Neurologic: grossly nonfocal Psychologic: normal affect and mood.  ECG: Normal  sinus rhythm with previously noted right bundle branch block at 83 beats per minute. He noted inferior Q waves in III and F.  LABS:  BMET    Component Value Date/Time   NA 139 09/10/2012 0601   K 3.8 09/10/2012 0601   CL 104 09/10/2012 0601   CO2 28 09/10/2012 0601   GLUCOSE 111* 09/10/2012 0601   BUN 10 09/10/2012 0601   CREATININE 0.83 09/10/2012 0601   CALCIUM 8.3* 09/10/2012 0601   GFRNONAA 83* 09/10/2012 0601   GFRAA >90 09/10/2012 0601     Hepatic Function Panel  Component Value Date/Time   PROT 6.6 09/01/2012 1027   ALBUMIN 3.6 09/01/2012 1027   AST 23 09/01/2012 1027   ALT 24 09/01/2012 1027   ALKPHOS 64 09/01/2012 1027   BILITOT 0.7 09/01/2012 1027     CBC    Component Value Date/Time   WBC 12.0* 09/10/2012 0601   RBC 4.33 09/10/2012 0601   HGB 12.6* 09/10/2012 0601   HCT 37.4* 09/10/2012 0601   PLT 174 09/10/2012 0601   MCV 86.4 09/10/2012 0601   MCH 29.1 09/10/2012 0601   MCHC 33.7 09/10/2012 0601   RDW 15.2 09/10/2012 0601   LYMPHSABS 1.9 09/29/2008 1419   MONOABS 0.6 09/29/2008 1419   EOSABS 0.1 09/29/2008 1419   BASOSABS 0.0 09/29/2008 1419     BNP No results found for this basename: probnp    Lipid Panel  No results found for this basename: chol, trig, hdl, cholhdl, vldl, ldlcalc     RADIOLOGY: No results found.    ASSESSMENT AND PLAN: [name] has history of mild nonobstructive CAD noted a cardiac catheterization in 2006 with approximately 20% LAD smooth narrowing proximally. His blood pressure today was 130/60 repeat by me 140/70. He does have bilateral lower 70 edema despite taking D. MedX 20 mg twice a day in addition to HCTZ and his quinapril regimen. I am recommending further titration of his D. MedX to 40 mg in the morning and 20 mg in the early afternoon. We also discussed the possibility of trying to reduce his Celebrex to when necessary rather than daily since this baby could also contribute to some of his edema. In the past,  we've tried support stockings but he is unable to tolerate this secondary to rash development. He does walk with a walker. He is living by himself. He's not having anginal symptoms. We'll recheck laboratory in the fasting state. I've asked that he follow up with Dr. Elyn Peers to see if his edema improves. I will see him in 6 months the chronologic reassessment.     Lennette Bihari, MD, [facility]  10/14/2013 11:33 AM

## 2013-10-29 ENCOUNTER — Encounter: Payer: Self-pay | Admitting: Cardiovascular Disease

## 2013-12-09 ENCOUNTER — Other Ambulatory Visit: Payer: Self-pay

## 2013-12-09 MED ORDER — POTASSIUM CHLORIDE CRYS ER 20 MEQ PO TBCR: 20.0000 meq | EXTENDED_RELEASE_TABLET | Freq: Two times a day (BID) | ORAL | Status: DC

## 2013-12-09 NOTE — Telephone Encounter (Signed)
Rx was sent to pharmacy electronically. 

## 2013-12-29 ENCOUNTER — Other Ambulatory Visit: Payer: Self-pay | Admitting: *Deleted

## 2013-12-29 MED ORDER — TORSEMIDE 20 MG PO TABS: ORAL_TABLET | ORAL | Status: DC

## 2014-04-30 ENCOUNTER — Ambulatory Visit (INDEPENDENT_AMBULATORY_CARE_PROVIDER_SITE_OTHER): Payer: Medicare Other | Admitting: Cardiovascular Disease

## 2014-04-30 VITALS — BP 140/68 | HR 73 | Ht 68.0 in | Wt 228.5 lb

## 2014-04-30 DIAGNOSIS — I119 Hypertensive heart disease without heart failure: Secondary | ICD-10-CM

## 2014-04-30 DIAGNOSIS — M1990 Unspecified osteoarthritis, unspecified site: Secondary | ICD-10-CM

## 2014-04-30 DIAGNOSIS — E119 Type 2 diabetes mellitus without complications: Secondary | ICD-10-CM

## 2014-04-30 DIAGNOSIS — R600 Localized edema: Secondary | ICD-10-CM

## 2014-04-30 DIAGNOSIS — I10 Essential (primary) hypertension: Secondary | ICD-10-CM

## 2014-04-30 DIAGNOSIS — I4510 Unspecified right bundle-branch block: Secondary | ICD-10-CM

## 2014-04-30 DIAGNOSIS — M129 Arthropathy, unspecified: Secondary | ICD-10-CM

## 2014-04-30 DIAGNOSIS — E785 Hyperlipidemia, unspecified: Secondary | ICD-10-CM

## 2014-04-30 DIAGNOSIS — R609 Edema, unspecified: Secondary | ICD-10-CM

## 2014-04-30 MED ORDER — TORSEMIDE 20 MG PO TABS: ORAL_TABLET | ORAL | Status: DC

## 2014-04-30 NOTE — Patient Instructions (Signed)
Your physician has recommended you make the following change in your medication: increase the torsemide to 40 mg twice daily.  Decrease the celebrex to 100 mg daily.  Your physician recommends that you schedule a follow-up appointment in: 6 months.

## 2014-05-03 ENCOUNTER — Other Ambulatory Visit: Payer: Self-pay | Admitting: *Deleted

## 2014-05-03 MED ORDER — METOPROLOL SUCCINATE ER 50 MG PO TB24: 50.0000 mg | ORAL_TABLET | Freq: Every day | ORAL | Status: DC

## 2014-05-03 NOTE — Telephone Encounter (Signed)
Rx was sent to pharmacy electronically. 

## 2014-05-21 ENCOUNTER — Encounter: Payer: Self-pay | Admitting: Cardiovascular Disease

## 2014-05-21 DIAGNOSIS — R600 Localized edema: Secondary | ICD-10-CM | POA: Insufficient documentation

## 2014-05-21 DIAGNOSIS — M1990 Unspecified osteoarthritis, unspecified site: Secondary | ICD-10-CM | POA: Insufficient documentation

## 2014-05-21 NOTE — Progress Notes (Signed)
Patient ID: [name], male   DOB: [birth year], 80 y.o.   MRN: [id number]     HPI: [name] is a 80 y.o. male who presents to the office today for 8 month cardiology evaluation.  [name]  has documented mild coronary artery disease by catheterization in 2006 treated medically. Additional problems include hypertension, type 2 diabetes mellitus, hyperlipidemia, peripheral neuropathy.  He underwent knee replacement surgery in 2013 well without cardiovascular compromise. An echo Doppler study  showed an EF of 55% with mild LVH, mild LA dilatation, mild to moderate mitral annular calcification trace MR, and mild pulmonary hypertension with mild TR with estimated pressure 32 mm. There was aortic sclerosis without stenosis with mild aortic insufficiency and mild pulmonic insufficiency.  Since I last saw him in November 2014, he has noticed some occasional swelling and has been taking torsemide 40 mg in the morning and 20 mg in the lateral portion of the day with improvement.  He has been taking Celebrex 200 mg daily for arthritic symptoms.  He is diabetic and is on Glucotrol XL 5 mg.  He has been taking quinapril HCT 20/25 for his hypertension.  He is tolerating pravastatin 40 mg for hyperlipidemia.  He does take omeprazole for GERD symptoms.  He denies recent chest tightness.  He does have chronic right bundle branch block.  He has arthritis of the shoulders and hips.  He presents for evaluation.  Past Medical History  Diagnosis Date  . Myocardial infarction     1990  . Hypertension   . Diabetes mellitus   . GERD (gastroesophageal reflux disease)   . Neuromuscular disorder     neuropathy  . 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 Arthritis   . Hyperlipidemia     Past Surgical History  Procedure Laterality Date  . Joint replacement      right knee  . Skin cancer excision      eyelid and melonoma on nose  . Cervical laminectomy    . Rotator cuff repair    . Back  surgery    . Prostate surgery    . Total knee arthroplasty  09/08/2012    Procedure: TOTAL KNEE ARTHROPLASTY;  Surgeon: Rudean Haskell, MD;  Location: [facility];  Service: Orthopedics;  Laterality: Left;  left total knee arthroplasty    No Known Allergies  Current Outpatient Prescriptions  Medication Sig Dispense Refill  . allopurinol (ZYLOPRIM) 300 MG tablet Take 1 tablet by mouth daily.      Marland Kitchen aspirin 81 MG tablet Take 81 mg by mouth daily.      . Calcium Carbonate-Vitamin D (CALCIUM 500 + D PO) Take 1 tablet by mouth 2 (two) times daily with a meal.      . celecoxib (CELEBREX) 200 MG capsule Take 200 mg by mouth daily.      Marland Kitchen escitalopram (LEXAPRO) 20 MG tablet Take 20 mg by mouth daily.      Marland Kitchen gabapentin (NEURONTIN) 600 MG tablet Take 600 mg by mouth. 2 TABLETS DAILY      . glipiZIDE (GLUCOTROL XL) 5 MG 24 hr tablet Take 5 mg by mouth daily with breakfast.      . omeprazole (PRILOSEC) 20 MG capsule Take 20 mg by mouth daily.      Marland Kitchen oxyCODONE (OXY IR/ROXICODONE) 5 MG immediate release tablet Take 1-2 tablets (5-10 mg total) by mouth every 4 (four) hours as needed.  90 tablet  0  .  potassium chloride SA (K-DUR,KLOR-CON) 20 MEQ tablet Take 1 tablet (20 mEq total) by mouth 2 (two) times daily.  60 tablet  10  . pravastatin (PRAVACHOL) 40 MG tablet Take 40 mg by mouth daily.       . quinapril-hydrochlorothiazide (ACCURETIC) 20-25 MG per tablet Take 1 tablet by mouth daily.      Marland Kitchen torsemide (DEMADEX) 20 MG tablet Take 2 tablets twice daily  120 tablet  6  . metoprolol succinate (TOPROL-XL) 50 MG 24 hr tablet Take 1 tablet (50 mg total) by mouth daily. Take with or immediately following a meal.  30 tablet  11   No current facility-administered medications for this visit.    History   Social History  . Marital Status: Married    Spouse Name: N/A    Number of Children: N/A  . Years of Education: N/A   Occupational History  . Not on file.   Social History Main Topics  . Smoking status:  Former Smoker -- 1.00 packs/day for 12 years    Types: Cigarettes  . Smokeless tobacco: Former Systems developer    Types: Chew  . Alcohol Use: No  . Drug Use: No  . Sexual Activity: Not on file   Other Topics Concern  . Not on file   Social History Narrative  . No narrative on file   Socially, he is widowed for one year. There is no tobacco history or alcohol. He completed 10th grade education. He is retired per he does use a cane.  History reviewed. No pertinent family history.  ROS General: Negative; No fevers, chills, or night sweats;  HEENT: Negative; No changes in vision or hearing, sinus congestion, difficulty swallowing Pulmonary: Negative; No cough, wheezing, shortness of breath, hemoptysis Cardiovascular: Negative; No chest pain, presyncope, syncope, palpatations Occasional lower extremity swelling. GI: Positive for GERD; No nausea, vomiting, diarrhea, or abdominal pain GU: Negative; No dysuria, hematuria, or difficulty voiding Musculoskeletal: Arthritic symptoms of the shoulders and hips  Hematologic/Oncology: Negative; no easy bruising, bleeding Endocrine: Negative; no heat/cold intolerance; no diabetes Neuro: Negative; no changes in balance, headaches Skin: Negative; No rashes or skin lesions Psychiatric: Negative; No behavioral problems, depression Sleep: Negative; No snoring, daytime sleepiness, hypersomnolence, bruxism, restless legs, hypnogognic hallucinations, no cataplexy Other comprehensive 14 point system review is negative.   PE BP 140/68  Pulse 73  Ht 5\' 8"  (1.727 m)  Wt 228 lb 8 oz (103.647 kg)  BMI 34.75 kg/m2  General: Alert, oriented, no distress.  Skin: normal turgor, no rashes HEENT: Normocephalic, atraumatic. Pupils round and reactive; sclera anicteric;no lid lag.  Nose without nasal septal hypertrophy Mouth/Parynx benign; Mallinpatti scale 3 Neck: No JVD, no carotid bruits with normal carotid upstroke. Lungs: clear to ausculatation and percussion; no  wheezing or rales Chest wall: Nontender to palpation Heart: RRR, s1 s2 normal; 1/6 systolic murmur left sternal border.  No S3 or S4 gallop.  No diastolic murmur.  No rubs, thrills or heaves. Abdomen: Mild diastases recti; soft, nontender; no hepatosplenomehaly, BS+; abdominal aorta nontender and not dilated by palpation. Back: No CVA tenderness Pulses 2+ Extremities: 1-2+ bilateral lower extremity edema pretibially distally; no clubbing cyanosis, Homan's sign negative  Neurologic: grossly nonfocal Psychologic: normal affect and mood.  ECG (independently read by me): Normal sinus rhythm at 73 beats per minute with right bundle branch block with repolarization changes.  QTc interval 467 ms.  ECG: Normal sinus rhythm with previously noted right bundle branch block at 83 beats per minute. He noted  inferior Q waves in III and F.  LABS:  BMET    Component Value Date/Time   NA 139 09/10/2012 0601   K 3.8 09/10/2012 0601   CL 104 09/10/2012 0601   CO2 28 09/10/2012 0601   GLUCOSE 111* 09/10/2012 0601   BUN 10 09/10/2012 0601   CREATININE 0.83 09/10/2012 0601   CALCIUM 8.3* 09/10/2012 0601   GFRNONAA 83* 09/10/2012 0601   GFRAA >90 09/10/2012 0601     Hepatic Function Panel     Component Value Date/Time   PROT 6.6 09/01/2012 1027   ALBUMIN 3.6 09/01/2012 1027   AST 23 09/01/2012 1027   ALT 24 09/01/2012 1027   ALKPHOS 64 09/01/2012 1027   BILITOT 0.7 09/01/2012 1027     CBC    Component Value Date/Time   WBC 12.0* 09/10/2012 0601   RBC 4.33 09/10/2012 0601   HGB 12.6* 09/10/2012 0601   HCT 37.4* 09/10/2012 0601   PLT 174 09/10/2012 0601   MCV 86.4 09/10/2012 0601   MCH 29.1 09/10/2012 0601   MCHC 33.7 09/10/2012 0601   RDW 15.2 09/10/2012 0601   LYMPHSABS 1.9 09/29/2008 1419   MONOABS 0.6 09/29/2008 1419   EOSABS 0.1 09/29/2008 1419   BASOSABS 0.0 09/29/2008 1419     BNP No results found for this basename: probnp    Lipid Panel  No results found for this basename:  chol,  trig,  hdl,  cholhdl,  vldl,  ldlcalc     RADIOLOGY: No results found.    ASSESSMENT AND PLAN: [name] has history of mild nonobstructive CAD noted a cardiac catheterization in 2006 with approximately 20% LAD smooth narrowing proximally.  Previous significant peripheral edema has improved with his current dose of torsemide at 40 mg in the morning and 20 mg in the later afternoon.  However, he continues to have some leg swelling, and I suggested that he increase this to 40 mg twice a day.  I've recommended he reduce his Celebrex to as needed and if he cannot tolerate this then take only 100 mg rather than 200 mg.  He does have chronic right bundle branch block, which is stable.  He tells me laboratory has been checking [place].  We will try to obtain these results.  The doses were made accordingly to his medical regimen.  He's not had any gout symptoms.  He's tolerating pravastatin, without myalgias.  His blood pressure today is controlled on his current dose of quinapril HCT Z. and this should improve slightly and further with his titration of torsemide.  I will see him in 6 months for reevaluation or sooner if problem arise.   Troy Sine, MD, [facility]  05/21/2014 3:21 PM

## 2014-11-08 ENCOUNTER — Other Ambulatory Visit: Payer: Self-pay

## 2014-11-08 MED ORDER — POTASSIUM CHLORIDE CRYS ER 20 MEQ PO TBCR: 20.0000 meq | EXTENDED_RELEASE_TABLET | Freq: Two times a day (BID) | ORAL | Status: DC

## 2014-11-08 NOTE — Telephone Encounter (Signed)
Rx sent to pharmacy   

## 2014-12-06 ENCOUNTER — Encounter: Payer: Self-pay | Admitting: Cardiovascular Disease

## 2014-12-06 ENCOUNTER — Ambulatory Visit (INDEPENDENT_AMBULATORY_CARE_PROVIDER_SITE_OTHER): Payer: Medicare Other | Admitting: Cardiovascular Disease

## 2014-12-06 VITALS — BP 112/70 | HR 64 | Ht 69.0 in | Wt 231.3 lb

## 2014-12-06 DIAGNOSIS — R600 Localized edema: Secondary | ICD-10-CM

## 2014-12-06 DIAGNOSIS — I4510 Unspecified right bundle-branch block: Secondary | ICD-10-CM

## 2014-12-06 DIAGNOSIS — E119 Type 2 diabetes mellitus without complications: Secondary | ICD-10-CM

## 2014-12-06 DIAGNOSIS — E785 Hyperlipidemia, unspecified: Secondary | ICD-10-CM

## 2014-12-06 DIAGNOSIS — M1990 Unspecified osteoarthritis, unspecified site: Secondary | ICD-10-CM

## 2014-12-06 DIAGNOSIS — Q752 Hypertelorism: Secondary | ICD-10-CM

## 2014-12-06 NOTE — Progress Notes (Signed)
Patient ID: [name], male   DOB: 1934/11/11, 81 y.o.   MRN: [id number]     HPI: [name] is a 81 y.o. male who presents to the office today for a 7 month cardiology evaluation.  [name]  has documented mild CAD by catheterization in 2006 treated medically. Additional problems include hypertension, type 2 diabetes mellitus, hyperlipidemia, peripheral neuropathy.  He underwent knee replacement surgery in 2013 well without cardiovascular compromise. An echo Doppler study  showed an EF of 55% with mild LVH, mild LA dilatation, mild to moderate mitral annular calcification trace MR, and mild pulmonary hypertension with mild TR with estimated pressure 32 mm. There was aortic sclerosis without stenosis with mild aortic insufficiency and mild pulmonic insufficiency.  He admits to LE swelling and has been taking torsemide 40 mg twice a day.  He has been taking Celebrex 200 mg daily for arthritic symptoms.  He is diabetic and is on Glucotrol XL 5 mg.  He has been taking quinapril HCT 20/25 and Toprol-XL 50 mg daily for his hypertension.  He is tolerating pravastatin 40 mg for hyperlipidemia.  He does take omeprazole for GERD symptoms.  He denies recent chest tightness.  He does have chronic right bundle branch block.  He has arthritis of the shoulders and hips.  He currently walks with a cane.  He presents for evaluation.  Past Medical History  Diagnosis Date  . Myocardial infarction     1990  . Hypertension   . Diabetes mellitus   . GERD (gastroesophageal reflux disease)   . Neuromuscular disorder     neuropathy  . 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 Arthritis   . Hyperlipidemia     Past Surgical History  Procedure Laterality Date  . Joint replacement      right knee  . Skin cancer excision      eyelid and melonoma on nose  . Cervical laminectomy    . Rotator cuff repair    . Back surgery    . Prostate surgery    . Total knee arthroplasty  09/08/2012   Procedure: TOTAL KNEE ARTHROPLASTY;  Surgeon: Rudean Haskell, MD;  Location: [facility];  Service: Orthopedics;  Laterality: Left;  left total knee arthroplasty    No Known Allergies  Current Outpatient Prescriptions  Medication Sig Dispense Refill  . aspirin 81 MG tablet Take 81 mg by mouth daily.    . Calcium Carbonate-Vitamin D (CALCIUM 500 + D PO) Take 1 tablet by mouth 2 (two) times daily with a meal.    . escitalopram (LEXAPRO) 20 MG tablet Take 20 mg by mouth daily.    Marland Kitchen glipiZIDE (GLUCOTROL XL) 5 MG 24 hr tablet Take 5 mg by mouth daily with breakfast.    . metoprolol succinate (TOPROL-XL) 50 MG 24 hr tablet Take 1 tablet (50 mg total) by mouth daily. Take with or immediately following a meal. 30 tablet 11  . omeprazole (PRILOSEC) 20 MG capsule Take 20 mg by mouth daily.    Marland Kitchen oxyCODONE (OXY IR/ROXICODONE) 5 MG immediate release tablet Take 1-2 tablets (5-10 mg total) by mouth every 4 (four) hours as needed. 90 tablet 0  . potassium chloride SA (K-DUR,KLOR-CON) 20 MEQ tablet Take 1 tablet (20 mEq total) by mouth 2 (two) times daily. 60 tablet 7  . pravastatin (PRAVACHOL) 40 MG tablet Take 40 mg by mouth daily.     . quinapril-hydrochlorothiazide (ACCURETIC) 20-25 MG per tablet Take 1  tablet by mouth daily.    Marland Kitchen torsemide (DEMADEX) 20 MG tablet Take 2 tablets twice daily 120 tablet 6   No current facility-administered medications for this visit.    History   Social History  . Marital Status: Married    Spouse Name: N/A    Number of Children: N/A  . Years of Education: N/A   Occupational History  . Not on file.   Social History Main Topics  . Smoking status: Former Smoker -- 1.00 packs/day for 12 years    Types: Cigarettes  . Smokeless tobacco: Former Systems developer    Types: Chew  . Alcohol Use: No  . Drug Use: No  . Sexual Activity: Not on file   Other Topics Concern  . Not on file   Social History Narrative   Socially, he is widowed. There is no tobacco history or alcohol. He  completed 10th grade education. He is retired per he does use a cane.  History reviewed. No pertinent family history.  ROS General: Negative; No fevers, chills, or night sweats;  HEENT: Negative; No changes in vision or hearing, sinus congestion, difficulty swallowing Pulmonary: Negative; No cough, wheezing, shortness of breath, hemoptysis Cardiovascular: Negative; No chest pain, presyncope, syncope, palpatations Occasional lower extremity swelling. GI: Positive for GERD; No nausea, vomiting, diarrhea, or abdominal pain GU: Negative; No dysuria, hematuria, or difficulty voiding Musculoskeletal: Arthritic symptoms of the shoulders and hips  Hematologic/Oncology: Negative; no easy bruising, bleeding Endocrine: Positive for diabetes mellitus Neuro: Negative; no changes in balance, headaches Skin: Negative; No rashes or skin lesions Psychiatric: Negative; No behavioral problems, depression Sleep: Negative; No snoring, daytime sleepiness, hypersomnolence, bruxism, restless legs, hypnogognic hallucinations, no cataplexy Other comprehensive 14 point system review is negative.   PE BP 112/70 mmHg  Pulse 64  Ht 5\' 9"  (1.753 m)  Wt 231 lb 4.8 oz (104.917 kg)  BMI 34.14 kg/m2  General: Alert, oriented, no distress.  Skin: normal turgor, no rashes HEENT: Normocephalic, atraumatic. Pupils round and reactive; sclera anicteric;no lid lag.  Nose without nasal septal hypertrophy Mouth/Parynx benign; Mallinpatti scale 3 Neck: No JVD, no carotid bruits with normal carotid upstroke. Lungs: clear to ausculatation and percussion; no wheezing or rales Chest wall: Nontender to palpation Heart: RRR, s1 s2 normal; 1/6 systolic murmur left sternal border.  No S3 or S4 gallop.  No diastolic murmur.  No rubs, thrills or heaves. Abdomen: Moderate diastases recti; soft, nontender; no hepatosplenomehaly, BS+; abdominal aorta nontender and not dilated by palpation. Back: No CVA tenderness Pulses  2+ Extremities: 1-2+ bilateral lower extremity edema pretibially distally; no clubbing cyanosis, Homan's sign negative  Neurologic: grossly nonfocal Psychologic: normal affect and mood.  ECG (independently read by me): Sinus rhythm at 64 bpm.  Right bundle branch block with repolarization changes.  QTc interval 497 ms.  04/30/2014 ECG (independently read by me): Normal sinus rhythm at 73 beats per minute with right bundle branch block with repolarization changes.  QTc interval 467 ms.  Prior ECG: Normal sinus rhythm with previously noted right bundle branch block at 83 beats per minute. He noted inferior Q waves in III and F.  LABS:  BMET    Component Value Date/Time   NA 139 09/10/2012 0601   K 3.8 09/10/2012 0601   CL 104 09/10/2012 0601   CO2 28 09/10/2012 0601   GLUCOSE 111* 09/10/2012 0601   BUN 10 09/10/2012 0601   CREATININE 0.83 09/10/2012 0601   CALCIUM 8.3* 09/10/2012 0601   GFRNONAA 83* 09/10/2012 0601  GFRAA >90 09/10/2012 0601     Hepatic Function Panel     Component Value Date/Time   PROT 6.6 09/01/2012 1027   ALBUMIN 3.6 09/01/2012 1027   AST 23 09/01/2012 1027   ALT 24 09/01/2012 1027   ALKPHOS 64 09/01/2012 1027   BILITOT 0.7 09/01/2012 1027     CBC  CBC Latest Ref Rng 09/10/2012 09/09/2012 09/01/2012  WBC 4.0 - 10.5 K/uL 12.0(H) 9.2 12.0(H)  Hemoglobin 13.0 - 17.0 g/dL 12.6(L) 12.1(L) 15.6  Hematocrit 39.0 - 52.0 % 37.4(L) 36.5(L) 44.9  Platelets 150 - 400 K/uL 174 140(L) 196     BNP No results found for: PROBNP  Lipid Panel  No results found for: CHOL   RADIOLOGY: No results found.    ASSESSMENT AND PLAN: [name] is a 81 year old gentleman who has a history of mild nonobstructive CAD noted at cardiac catheterization in 2006 with approximately 20% LAD smooth narrowing proximally.  He continues to have mild residual peripheral edema has improved with his current dose of torsemide at 40 mg twice a day which was increased at his last  office visit.   I again have  recommended that he reduce his Celebrex to as needed rather than 200 mg daily.  He has chronic right bundle branch block, which is stable.  Laboratory was recently drawn last week and [facility].  I will last obtain these results.  I have suggested the addition of sport support stockings to aid in his lower extremity edema.  We will try to obtain these results.   He has not had any gout symptoms.  He is tolerating pravastatin, without myalgias with a target LDL of less than 70.  His blood pressure today is controlled on his current dose of quinapril HCTZ and Toprol-XL 50 mg.  Since he is followed on monthly basis by his primary physician.  I will see him in one year for cardiology reevaluation or sooner if problems arise.  Time spent: 25 minutes   Troy Sine, MD, [facility]  12/06/2014 6:08 PM

## 2014-12-06 NOTE — Patient Instructions (Signed)
Your physician has recommended you make the following change in your medication: stop the celebrex.  Dr. Claiborne Billings recommends that you wear your support stockings.   Your physician wants you to follow-up in: 1 year or sooner if needed with Dr. Claiborne Billings. You will receive a reminder letter in the mail two months in advance. If you don't receive a letter, please call our office to schedule the follow-up appointment.

## 2014-12-29 ENCOUNTER — Other Ambulatory Visit: Payer: Self-pay

## 2014-12-30 ENCOUNTER — Other Ambulatory Visit: Payer: Self-pay

## 2014-12-31 ENCOUNTER — Other Ambulatory Visit: Payer: Self-pay

## 2015-01-04 ENCOUNTER — Other Ambulatory Visit: Payer: Self-pay

## 2015-01-04 MED ORDER — TORSEMIDE 20 MG PO TABS: 40.0000 mg | ORAL_TABLET | Freq: Two times a day (BID) | ORAL | Status: DC

## 2015-01-04 NOTE — Telephone Encounter (Signed)
Rx(s) sent to pharmacy electronically.  

## 2015-02-16 ENCOUNTER — Encounter: Payer: Self-pay | Admitting: Cardiovascular Disease

## 2015-07-12 ENCOUNTER — Other Ambulatory Visit: Payer: Self-pay | Admitting: Cardiovascular Disease

## 2015-07-12 NOTE — Telephone Encounter (Signed)
Rx(s) sent to pharmacy electronically.  

## 2015-12-06 ENCOUNTER — Ambulatory Visit: Payer: PRIVATE HEALTH INSURANCE | Admitting: Cardiovascular Disease

## 2016-01-02 ENCOUNTER — Other Ambulatory Visit: Payer: Self-pay | Admitting: Cardiovascular Disease

## 2016-01-02 NOTE — Telephone Encounter (Signed)
Rx(s) sent to pharmacy electronically.  

## 2016-01-13 ENCOUNTER — Other Ambulatory Visit: Payer: Self-pay | Admitting: Cardiovascular Disease

## 2016-01-13 NOTE — Telephone Encounter (Signed)
Rx(s) sent to pharmacy electronically.  

## 2016-01-19 ENCOUNTER — Other Ambulatory Visit: Payer: Self-pay | Admitting: Orthopedic Surgery

## 2016-01-19 DIAGNOSIS — T8484XD Pain due to internal orthopedic prosthetic devices, implants and grafts, subsequent encounter: Secondary | ICD-10-CM

## 2016-01-19 DIAGNOSIS — Z96659 Presence of unspecified artificial knee joint: Principal | ICD-10-CM

## 2016-02-06 ENCOUNTER — Ambulatory Visit (INDEPENDENT_AMBULATORY_CARE_PROVIDER_SITE_OTHER): Payer: Medicare Other | Admitting: Cardiovascular Disease

## 2016-02-06 ENCOUNTER — Encounter: Payer: Self-pay | Admitting: Cardiovascular Disease

## 2016-02-06 VITALS — BP 132/56 | HR 87 | Ht 68.0 in | Wt 209.3 lb

## 2016-02-06 DIAGNOSIS — E1142 Type 2 diabetes mellitus with diabetic polyneuropathy: Secondary | ICD-10-CM

## 2016-02-06 DIAGNOSIS — I4510 Unspecified right bundle-branch block: Secondary | ICD-10-CM

## 2016-02-06 DIAGNOSIS — E785 Hyperlipidemia, unspecified: Secondary | ICD-10-CM

## 2016-02-06 DIAGNOSIS — I119 Hypertensive heart disease without heart failure: Secondary | ICD-10-CM | POA: Diagnosis not present

## 2016-02-06 DIAGNOSIS — I2510 Atherosclerotic heart disease of native coronary artery without angina pectoris: Secondary | ICD-10-CM

## 2016-02-06 DIAGNOSIS — R600 Localized edema: Secondary | ICD-10-CM

## 2016-02-06 NOTE — Patient Instructions (Addendum)
Your physician wants you to follow-up in: 1 year or sooner if needed. You will receive a reminder letter in the mail two months in advance. If you don't receive a letter, please call our office to schedule the follow-up appointment.  If you need a refill on your cardiac medications before your next appointment, please call your pharmacy.  Your physician has requested that you have an echocardiogram. Echocardiography is a painless test that uses sound waves to create images of your heart. It provides your doctor with information about the size and shape of your heart and how well your heart's chambers and valves are working. This procedure takes approximately one hour. There are no restrictions for this procedure.

## 2016-02-06 NOTE — Progress Notes (Signed)
Patient ID: [name], male   DOB: 10-01-1934, 82 y.o.   MRN: [id number]     Primary:  Ethan Scales, PA-C  HPI: [name] is a 82 y.o. male who presents to the office today for 68 month cardiology evaluation.  [name]  has documented mild CAD by catheterization in 2006 which has been treated medically.   Additional problems include hypertension, type 2 diabetes mellitus, hyperlipidemia, obesity, and  peripheral neuropathy.  He underwent knee replacement surgery in 2013 well without cardiovascular compromise. An echo Doppler study  showed an EF of 55% with mild LVH, mild LA dilatation, mild to moderate mitral annular calcification trace MR, and mild pulmonary hypertension with mild TR with estimated pressure 32 mm. There was aortic sclerosis without stenosis with mild aortic insufficiency and mild pulmonic insufficiency.  He has a history of LE swelling which has improved with torsemide.  He has been taking Celebrex 200 mg daily for arthritic symptoms.  He is diabetic and is on Glucotrol XL 5 mg.  He has been taking quinapril HCT 20/25 and Toprol-XL 50 mg daily for his hypertension.  He is tolerating pravastatin 40 mg for hyperlipidemia.  He does take omeprazole for GERD symptoms.  He denies recent chest tightness.  Over the past year, he has lost weight purposefully.  He does have chronic right bundle branch block.  He denies any palpitations.  He denies any episodes of presyncope or syncope.  He has arthritis of the shoulders and hips.  He currently walks with a cane.  He presents for evaluation.  Past Medical History  Diagnosis Date  . Myocardial infarction ([place])     1990  . Hypertension   . Diabetes mellitus   . GERD (gastroesophageal reflux disease)   . Neuromuscular disorder ([facility])     neuropathy  . Cancer ([place])     skin cancer,melonoma on nose  . Arthritis   . Hyperlipidemia     Past Surgical History  Procedure Laterality Date  . Joint replacement      right  knee  . Skin cancer excision      eyelid and melonoma on nose  . Cervical laminectomy    . Rotator cuff repair    . Back surgery    . Prostate surgery    . Total knee arthroplasty  09/08/2012    Procedure: TOTAL KNEE ARTHROPLASTY;  Surgeon: Rudean Haskell, MD;  Location: [place];  Service: Orthopedics;  Laterality: Left;  left total knee arthroplasty    No Known Allergies  Current Outpatient Prescriptions  Medication Sig Dispense Refill  . allopurinol (ZYLOPRIM) 300 MG tablet Take 1 tablet by mouth daily.    Marland Kitchen aspirin 81 MG tablet Take 81 mg by mouth daily.    Marland Kitchen gabapentin (NEURONTIN) 600 MG tablet Take 1,200 mg by mouth daily.    . metFORMIN (GLUCOPHAGE) 500 MG tablet Take 1 tablet by mouth 2 (two) times daily.    . metoprolol succinate (TOPROL-XL) 50 MG 24 hr tablet Take 1 tablet (50 mg total) by mouth daily. Take with or immediately following a meal. 30 tablet 11  . NAMENDA XR 28 MG CP24 24 hr capsule Take 1 capsule by mouth daily.    Marland Kitchen omeprazole (PRILOSEC) 20 MG capsule Take 20 mg by mouth daily.    Marland Kitchen oxyCODONE (OXY IR/ROXICODONE) 5 MG immediate release tablet Take 1-2 tablets (5-10 mg total) by mouth every 4 (four) hours as needed. 90 tablet 0  . potassium chloride  SA (K-DUR,KLOR-CON) 20 MEQ tablet TAKE 1 TABLET BY MOUTH 2 TIMES DAILY 60 tablet 1  . pravastatin (PRAVACHOL) 40 MG tablet Take 40 mg by mouth daily.     . quinapril-hydrochlorothiazide (ACCURETIC) 20-25 MG per tablet Take 1 tablet by mouth daily.    . rosuvastatin (CRESTOR) 5 MG tablet Take 1 tablet by mouth daily.    Marland Kitchen torsemide (DEMADEX) 20 MG tablet TAKE 2 TABLETS BY MOUTH 2 TIMES DAILY 120 tablet 1   No current facility-administered medications for this visit.    Social History   Social History  . Marital Status: Married    Spouse Name: N/A  . Number of Children: N/A  . Years of Education: N/A   Occupational History  . Not on file.   Social History Main Topics  . Smoking status: Former Smoker -- 1.00  packs/day for 12 years    Types: Cigarettes  . Smokeless tobacco: Former Systems developer    Types: Chew  . Alcohol Use: No  . Drug Use: No  . Sexual Activity: Not on file   Other Topics Concern  . Not on file   Social History Narrative   Socially, he is widowed. There is no tobacco history or alcohol. He completed 10th grade education. He is retired per he does use a cane.  No family history on file.  ROS General: Negative; No fevers, chills, or night sweats;  HEENT: Negative; No changes in vision or hearing, sinus congestion, difficulty swallowing Pulmonary: Negative; No cough, wheezing, shortness of breath, hemoptysis Cardiovascular: Negative; No chest pain, presyncope, syncope, palpatations Occasional lower extremity swelling. GI: Positive for GERD; No nausea, vomiting, diarrhea, or abdominal pain GU: Negative; No dysuria, hematuria, or difficulty voiding Musculoskeletal: Arthritic symptoms of the shoulders and hips  Hematologic/Oncology: Negative; no easy bruising, bleeding Endocrine: Positive for diabetes mellitus Neuro: Negative; no changes in balance, headaches Skin: Negative; No rashes or skin lesions Psychiatric: Negative; No behavioral problems, depression Sleep: Negative; No snoring, daytime sleepiness, hypersomnolence, bruxism, restless legs, hypnogognic hallucinations, no cataplexy Other comprehensive 14 point system review is negative.   PE BP 132/56 mmHg  Pulse 87  Ht 5' 8" (1.727 m)  Wt 209 lb 4.8 oz (94.938 kg)  BMI 31.83 kg/m2   Repeat blood pressure by me was 110/60.  Wt Readings from Last 3 Encounters:  02/06/16 209 lb 4.8 oz (94.938 kg)  12/06/14 231 lb 4.8 oz (104.917 kg)  04/30/14 228 lb 8 oz (103.647 kg)   General: Alert, oriented, no distress.  Skin: normal turgor, no rashes HEENT: Normocephalic, atraumatic. Pupils round and reactive; sclera anicteric;no lid lag.  Nose without nasal septal hypertrophy Mouth/Parynx benign; Mallinpatti scale 3 Neck:  No JVD, no carotid bruits with normal carotid upstroke. Lungs: clear to ausculatation and percussion; no wheezing or rales Chest wall: Nontender to palpation Heart: RRR, s1 s2 normal; 1/6 systolic murmur left sternal border.  No S3 or S4 gallop.  No diastolic murmur.  No rubs, thrills or heaves. Abdomen: Large diastases recti; soft, nontender; no hepatosplenomehaly, BS+; abdominal aorta nontender and not dilated by palpation. Back: No CVA tenderness Pulses 2+ Extremities: Significant improvement in previous lower extremity edema, trace at his ankles bilaterally ; no clubbing cyanosis, Homan's sign negative  Neurologic: grossly nonfocal Psychologic: normal affect and mood.  ECG (independently read by me): Normal sinus rhythm at 87 bpm.  Right bundle branch block with repolarization changes.  Left axis deviation.  LVH voltage criteria.  Inferior Q waves , more prominent.  ECG (  independently read by me): Sinus rhythm at 64 bpm.  Right bundle branch block with repolarization changes.  QTc interval 497 ms.  04/30/2014 ECG (independently read by me): Normal sinus rhythm at 73 beats per minute with right bundle branch block with repolarization changes.  QTc interval 467 ms.  Prior ECG: Normal sinus rhythm with previously noted right bundle branch block at 83 beats per minute. He noted inferior Q waves in III and F.  LABS:  BMP Latest Ref Rng 09/10/2012 09/09/2012 09/01/2012  Glucose 70 - 99 mg/dL 111(H) 146(H) 207(H)  BUN 6 - 23 mg/dL _0 Creatinine 0.50 - 1.35 mg/dL 0.83 0.87 0.76  Sodium 135 - 145 mEq/L 139 135 138  Potassium 3.5 - 5.1 mEq/L 3.8 3.4(L) 3.4(L)  Chloride 96 - 112 mEq/L 104 100 98  CO2 19 - 32 mEq/L _1 Calcium 8.4 - 10.5 mg/dL 8.3(L) 7.9(L) 9.1   Hepatic Function Latest Ref Rng 09/01/2012 09/29/2008  Total Protein 6.0 - 8.3 g/dL 6.6 5.9(L)  Albumin 3.5 - 5.2 g/dL 3.6 3.7  AST 0 - 37 U/L 23 18  ALT 0 - 53 U/L 24 15  Alk Phosphatase 39 - 117 U/L 64 55  Total  Bilirubin 0.3 - 1.2 mg/dL 0.7 1.2   CBC Latest Ref Rng 09/10/2012 09/09/2012 09/01/2012  WBC 4.0 - 10.5 K/uL 12.0(H) 9.2 12.0(H)  Hemoglobin 13.0 - 17.0 g/dL 12.6(L) 12.1(L) 15.6  Hematocrit 39.0 - 52.0 % 37.4(L) 36.5(L) 44.9  Platelets 150 - 400 K/uL 174 140(L) 196   Lab Results  Component Value Date   MCV 86.4 09/10/2012   MCV 85.3 09/09/2012   MCV 84.6 09/01/2012   No results found for: TSH  Lipid Panel  No results found for: CHOL, TRIG, HDL, CHOLHDL, VLDL, LDLCALC, LDLDIRECT    RADIOLOGY: No results found.    ASSESSMENT AND PLAN: [name] is a 82 year old gentleman who has a history of mild nonobstructive CAD noted at cardiac catheterization in 2006 with approximately 20% LAD smooth narrowing proximally.  Since I last saw him, he has continues to be stable from a cardiac standpoint.  He specifically denies any anginal symptoms.  He has purposefully lost weight but continues to be mildly obese.  He has been maintained on torsemide 30 mg twice a day, which has had significant benefit in his previous 2-3+ edema.  He continues to be on Toprol-XL 50 mg in addition to quinapril HCT for hypertension control.  His blood pressure today is controlled and on repeat by me was 110/60.  He is diabetic on metformin.  He does have history of peripheral neuropathy which may be due to his diabetes mellitus for which she is on gabapentin.  His ECG shows normal sinus rhythm with right bundle branch block which is previously been noted.  He has more prominent Q waves on his ECG today.  Remotely, he had undergone a nuclear perfusion study in 2012 which suggested possible moderate mid to basal inferior scar.  His last echo Doppler study was in 2013.  I am recommending that he undergo a four-year follow-up echo assessment in light of his more prominent inferior Q waves on today's ECG.  He tells me he recently had complete blood work done by [employer].  I will try to obtain these results.  Target LDL is  less than 70 in this gentleman with CAD and diabetes mellitus.  He has been on allopurinol for remote history of gout.  It may be  worthwhile  to consider discontinuing the quinapril with HCTZ but continue the ACE inhibitor alone ue to this gout, since his edema is being treated with torsemide.  I will see him in 6-12 months for follow up evaluation and will contact him regarding his echo Doppler data.   Time spent: 25 minutes   Troy Sine, MD, [facility]  02/06/2016 11:38 AM

## 2016-02-10 ENCOUNTER — Other Ambulatory Visit: Payer: Medicare Other

## 2016-02-13 ENCOUNTER — Inpatient Hospital Stay: Admission: RE | Admit: 2016-02-13 | Payer: Medicare Other | Source: Ambulatory Visit

## 2016-02-13 ENCOUNTER — Other Ambulatory Visit: Payer: Medicare Other

## 2016-02-20 ENCOUNTER — Other Ambulatory Visit: Payer: Self-pay

## 2016-02-20 ENCOUNTER — Other Ambulatory Visit: Payer: Medicare Other

## 2016-02-20 ENCOUNTER — Ambulatory Visit (HOSPITAL_COMMUNITY): Payer: Medicare Other | Attending: Cardiology

## 2016-02-20 DIAGNOSIS — I059 Rheumatic mitral valve disease, unspecified: Secondary | ICD-10-CM | POA: Insufficient documentation

## 2016-02-20 DIAGNOSIS — I119 Hypertensive heart disease without heart failure: Secondary | ICD-10-CM | POA: Diagnosis present

## 2016-02-20 DIAGNOSIS — I351 Nonrheumatic aortic (valve) insufficiency: Secondary | ICD-10-CM | POA: Diagnosis not present

## 2016-02-20 DIAGNOSIS — I071 Rheumatic tricuspid insufficiency: Secondary | ICD-10-CM | POA: Diagnosis not present

## 2016-02-28 ENCOUNTER — Ambulatory Visit
Admission: RE | Admit: 2016-02-28 | Discharge: 2016-02-28 | Disposition: A | Discharge status: Home / Self Care | Payer: Medicare Other | Source: Ambulatory Visit | Attending: Orthopedic Surgery | Admitting: Orthopedic Surgery

## 2016-02-28 DIAGNOSIS — Z96659 Presence of unspecified artificial knee joint: Principal | ICD-10-CM

## 2016-02-28 DIAGNOSIS — T8484XD Pain due to internal orthopedic prosthetic devices, implants and grafts, subsequent encounter: Secondary | ICD-10-CM

## 2016-03-07 ENCOUNTER — Telehealth: Payer: Self-pay | Admitting: *Deleted

## 2016-03-07 NOTE — Telephone Encounter (Signed)
-----   Message from Troy Sine, MD sent at 02/25/2016  2:32 PM EDT ----- Hypokinesis of the basal inferior lateral wall with overall preserved LV function; grade 1 diastolic dysfunction; elevated LV filling pressure; mild AI; mild biatrial enlargement; trace TR with mildly elevated pulmonary pressure.

## 2016-03-07 NOTE — Telephone Encounter (Signed)
Patient notified of results.

## 2016-03-09 ENCOUNTER — Other Ambulatory Visit: Payer: Self-pay | Admitting: Cardiovascular Disease

## 2016-03-09 NOTE — Telephone Encounter (Signed)
REFILL 

## 2016-11-02 ENCOUNTER — Telehealth: Payer: Self-pay | Admitting: *Deleted

## 2016-11-02 NOTE — Telephone Encounter (Signed)
Faxed clearance to [facility] [place] for cataract surgery.

## 2017-01-17 IMAGING — CT CT KNEE*R* W/O CM
1 series · 12 of 14 positions shown, 15 images · non-contrast
Comparison: None.

CLINICAL DATA: Total knee replacement 10 years ago.  Pain.

EXAM:
CT OF THE RIGHT KNEE WITHOUT CONTRAST
TECHNIQUE: Multidetector CT imaging of the RIGHT knee was performed according
to the standard protocol. Multiplanar CT image reconstructions were
also generated.

[Series 4: knee soft tissue (person_name) · axial · 0.38mm/px · z∈[-294,-114]mm · 12 of 72 slices shown, 15 images]
[im 6/72  soft-tissue]
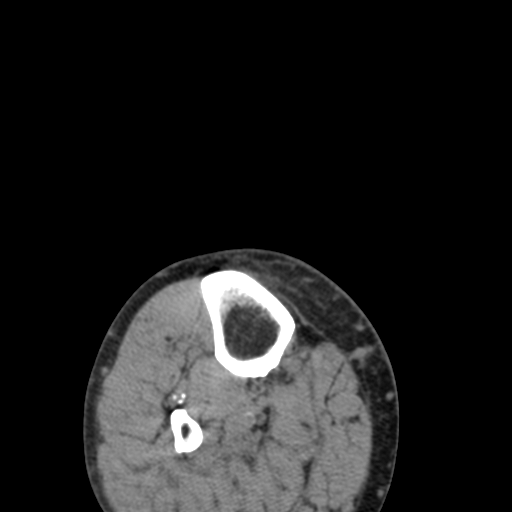
[im 6/72  bone]
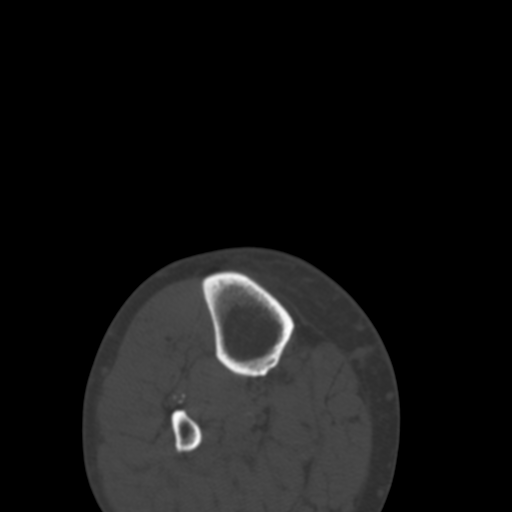
[im 11/72  bone]
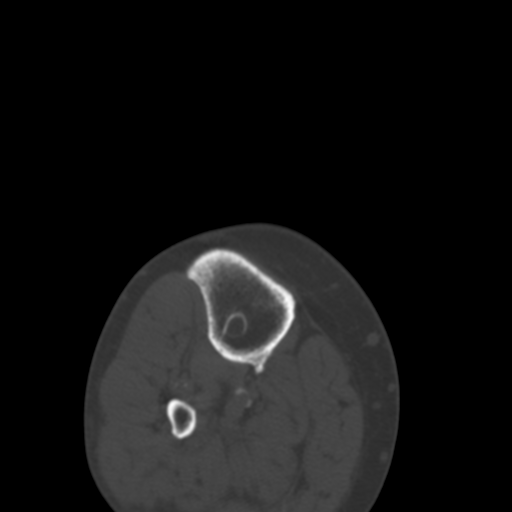
[im 17/72  bone]
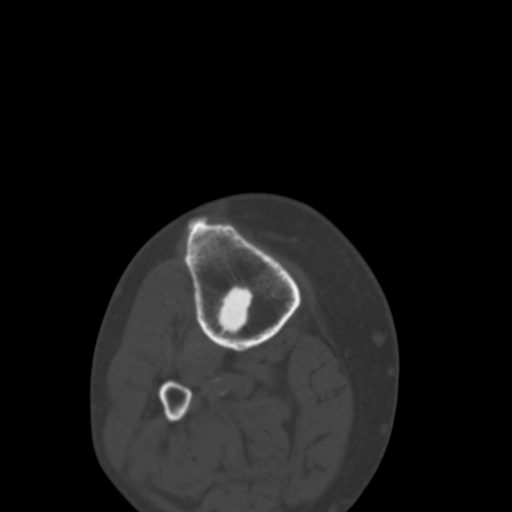
[im 22/72  bone]
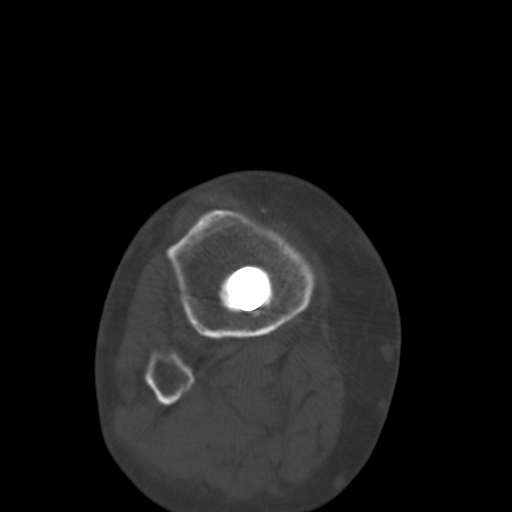
[im 28/72  soft-tissue]
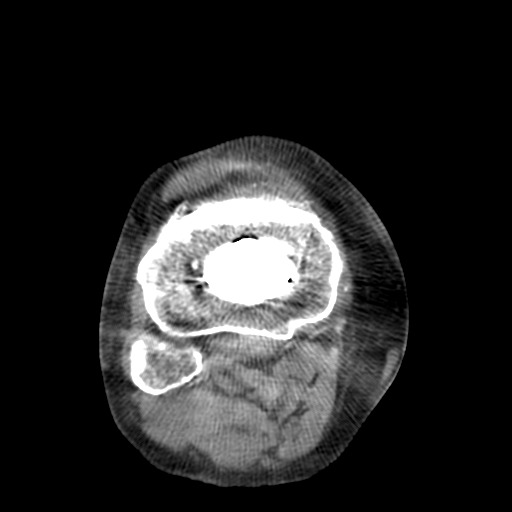
[im 28/72  bone]
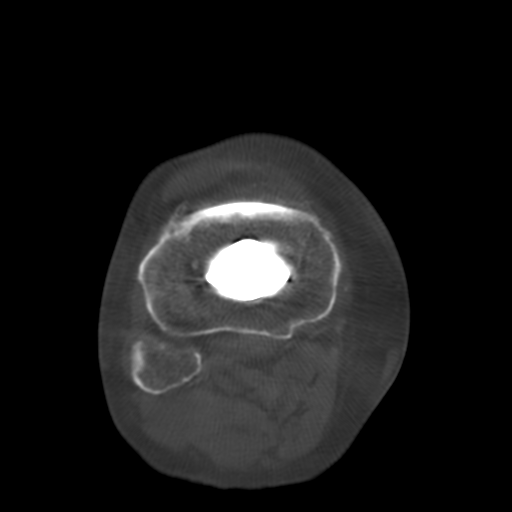
[im 33/72  bone]
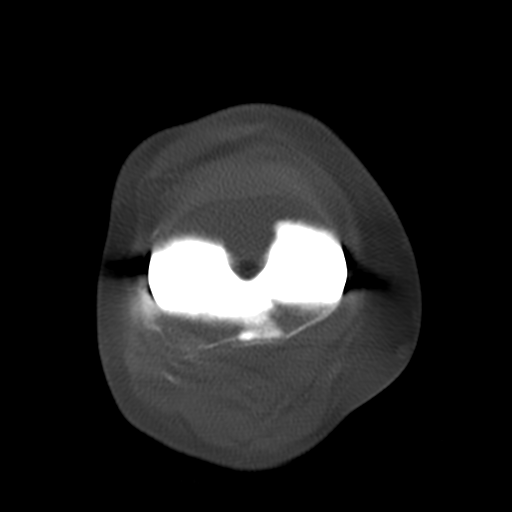
[im 39/72  bone]
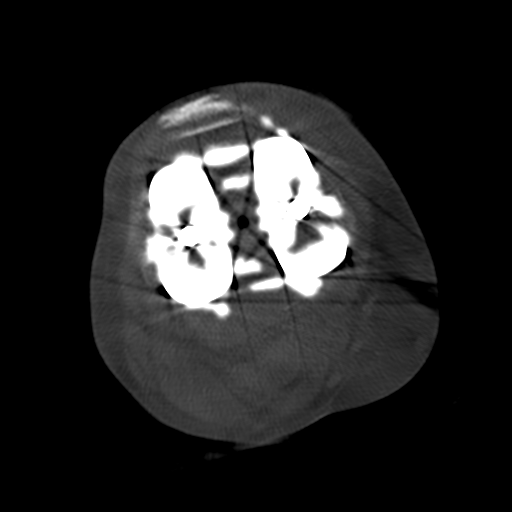
[im 44/72  bone]
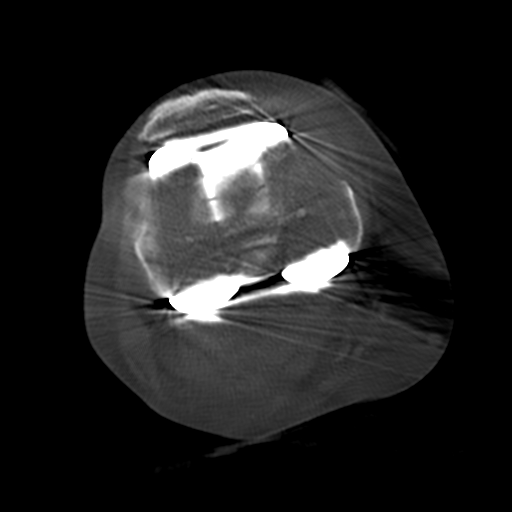
[im 50/72  soft-tissue]
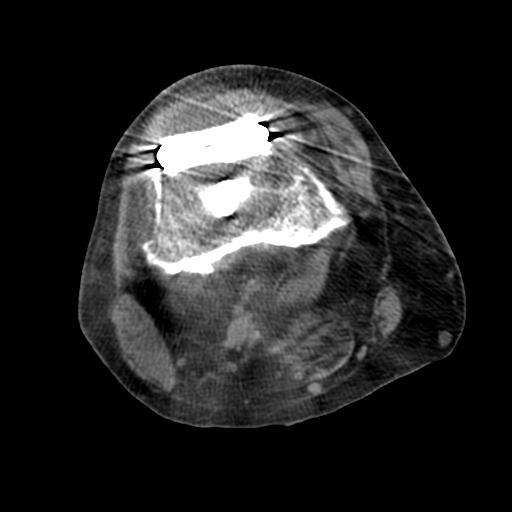
[im 50/72  bone]
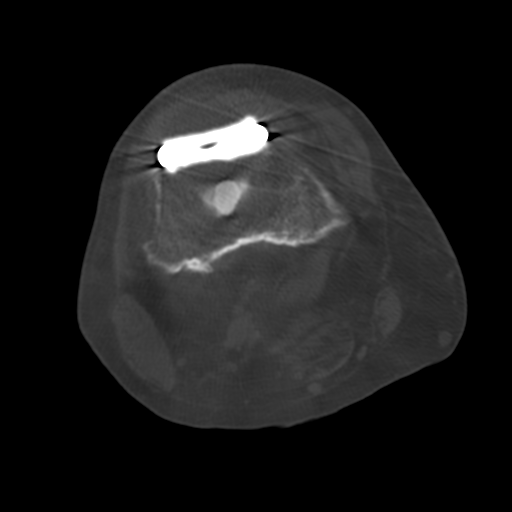
[im 55/72  bone]
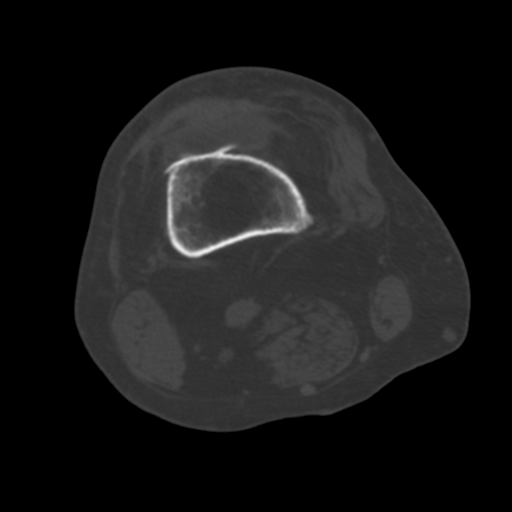
[im 61/72  bone]
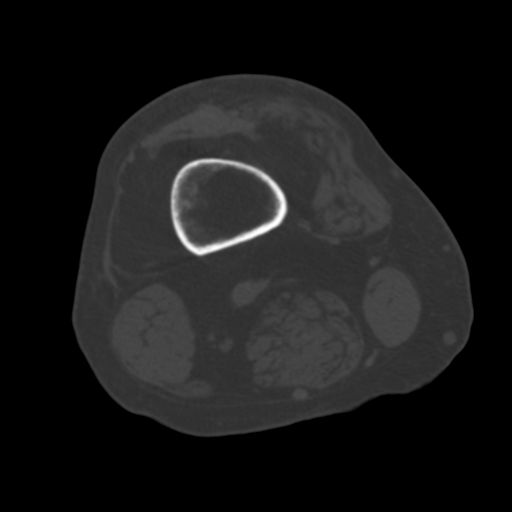
[im 66/72  bone]
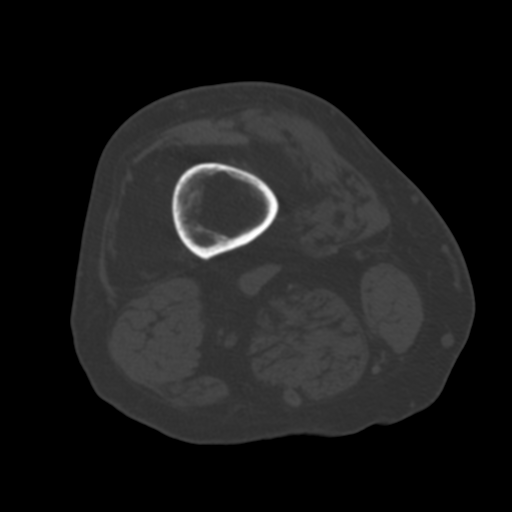

[12 of 14 positions shown; findings below may reference images not displayed]

FINDINGS: There is a total right knee arthroplasty. There is no fracture
dislocation. There is susceptibility artifact resulting from the
orthopedic hardware partially obscuring the adjacent soft tissue
osseous structures. There is no hardware failure or complication.
There is no aggressive lytic or sclerotic osseous lesion. There is a
small joint effusion. The muscles are normal. The alignment is
normal.
IMPRESSION: 1. Right total knee arthroplasty without failure or complication.

## 2017-03-08 ENCOUNTER — Other Ambulatory Visit: Payer: Self-pay | Admitting: Cardiovascular Disease

## 2017-03-08 NOTE — Telephone Encounter (Signed)
REFILL 

## 2017-03-14 ENCOUNTER — Other Ambulatory Visit: Payer: Self-pay | Admitting: Cardiovascular Disease

## 2017-03-14 NOTE — Telephone Encounter (Signed)
REFILL 

## 2017-03-28 ENCOUNTER — Ambulatory Visit (INDEPENDENT_AMBULATORY_CARE_PROVIDER_SITE_OTHER): Payer: Medicare Other | Admitting: Cardiovascular Disease

## 2017-03-28 ENCOUNTER — Encounter: Payer: Self-pay | Admitting: Cardiovascular Disease

## 2017-03-28 VITALS — BP 124/58 | HR 78 | Ht 68.0 in | Wt 224.0 lb

## 2017-03-28 DIAGNOSIS — I4510 Unspecified right bundle-branch block: Secondary | ICD-10-CM

## 2017-03-28 DIAGNOSIS — I2510 Atherosclerotic heart disease of native coronary artery without angina pectoris: Secondary | ICD-10-CM

## 2017-03-28 DIAGNOSIS — I119 Hypertensive heart disease without heart failure: Secondary | ICD-10-CM | POA: Diagnosis not present

## 2017-03-28 DIAGNOSIS — E785 Hyperlipidemia, unspecified: Secondary | ICD-10-CM

## 2017-03-28 DIAGNOSIS — Z6834 Body mass index (BMI) 34.0-34.9, adult: Secondary | ICD-10-CM

## 2017-03-28 DIAGNOSIS — E6609 Other obesity due to excess calories: Secondary | ICD-10-CM

## 2017-03-28 DIAGNOSIS — R600 Localized edema: Secondary | ICD-10-CM

## 2017-03-28 NOTE — Patient Instructions (Signed)
Your physician wants you to follow-up in: 6 months or sooner if needed. You will receive a reminder letter in the mail two months in advance. If you don't receive a letter, please call our office to schedule the follow-up appointment.  If you need a refill on your cardiac medications before your next appointment, please call your pharmacy.   How to Use Compression Stockings Compression stockings are elastic socks that squeeze the legs. They help to increase blood flow to the legs, decrease swelling in the legs, and reduce the chance of developing blood clots in the lower legs. Compression stockings are often used by people who:  Are recovering from surgery.  Have poor circulation in their legs.  Are prone to getting blood clots in their legs.  Have varicose veins.  Sit or stay in bed for long periods of time. How to use compression stockings Before you put on your compression stockings:  Make sure that they are the correct size. If you do not know your size, ask your health care provider.  Make sure that they are clean, dry, and in good condition.  Check them for rips and tears. Do not put them on if they are ripped or torn. Put your stockings on first thing in the morning, before you get out of bed. Keep them on for as long as your health care provider advises. When you are wearing your stockings:  Keep them as smooth as possible. Do not allow them to bunch up. It is especially important to prevent the stockings from bunching up around your toes or behind your knees.  Do not roll the stockings downward and leave them rolled down. This can decrease blood flow to your leg.  Change them right away if they become wet or dirty. 1.  When you take off your stockings, inspect your legs and feet. Anything that does not seem normal may require medical attention. Look for:  Open sores.  Red spots.  Swelling. Information and tips  Do not stop wearing your compression stockings without  talking to your health care provider first.  Wash your stockings every day with mild detergent in cold or warm water. Do not use bleach. Air-dry your stockings or dry them in a clothes dryer on low heat.  Replace your stockings every 3-6 months.  If skin moisturizing is part of your treatment plan, apply lotion or cream at night so that your skin will be dry when you put on the stockings in the morning. It is harder to put the stockings on when you have lotion on your legs or feet. Contact a health care provider if: Remove your stockings and seek medical care if:  You have a feeling of pins and needles in your feet or legs.  You have any new changes in your skin.  You have skin lesions that are getting worse.  You have swelling or pain that is getting worse. Get help right away if:  You have numbness or tingling in your lower legs that does not get better right after you take the stockings off.  Your toes or feet become cold and blue.  You develop open sores or red spots on your legs that do not go away.  You see or feel a warm spot on your leg.  You have new swelling or soreness in your leg.  You are short of breath or you have chest pain for no reason.  You have a rapid or irregular heartbeat.  You feel light-headed  or dizzy. This information is not intended to replace advice given to you by your health care provider. Make sure you discuss any questions you have with your health care provider. Document Released: 09/09/2009 Document Revised: 04/11/2016 Document Reviewed: 10/20/2014 Elsevier Interactive Patient Education  2017 [employer].

## 2017-03-28 NOTE — Progress Notes (Signed)
Patient ID: [name], male   DOB: 07/09/1934, 83 y.o.   MRN: [id number]     Primary:  Heide Scales, PA-C  HPI: [name] is a 83 y.o. male who presents to the office today for 23 month cardiology evaluation.  [name]  has documented mild CAD by catheterization in 2006 which has been treated medically.   Additional problems include hypertension, type 2 diabetes mellitus, hyperlipidemia, obesity, and  peripheral neuropathy.  He underwent knee replacement surgery in 2013 well without cardiovascular compromise. An echo Doppler study  showed an EF of 55% with mild LVH, mild LA dilatation, mild to moderate mitral annular calcification trace MR, and mild pulmonary hypertension with mild TR with estimated pressure 32 mm. There was aortic sclerosis without stenosis with mild aortic insufficiency and mild pulmonic insufficiency.  Since I last saw him, he no longer is on pravastatin is now on Crestor 5 mg for hyperlipidemia.  He has been taking torsemide 40 mg twice a day with significant improvement in his prior leg swelling.  He is on metformin 500 mg for his diabetes mellitus.  With reference to hypertension.  He has been on quinapril HCT 20/25 and Toprol-XL 50 mg daily.  He has a history of gout on allopurinol.  He also is on Namenda act saw our 28 mg.  He continues to take omeprazole for GERD symptoms.  He denies recent chest tightness.  He has chronic right bundle branch block.  He denies any palpitations.  He denies any episodes of presyncope or syncope.  He has arthritis of the shoulders and hips.  He currently walks with a cane.  He presents for evaluation.  Past Medical History:  Diagnosis Date  . Arthritis   . Cancer ([place])    skin cancer,melonoma on nose  . Diabetes mellitus   . GERD (gastroesophageal reflux disease)   . Hyperlipidemia   . Hypertension   . Myocardial infarction ([place])    1990  . Neuromuscular disorder ([place])    neuropathy    Past Surgical History:    Procedure Laterality Date  . BACK SURGERY    . CERVICAL LAMINECTOMY    . JOINT REPLACEMENT     right knee  . PROSTATE SURGERY    . ROTATOR CUFF REPAIR    . SKIN CANCER EXCISION     eyelid and melonoma on nose  . TOTAL KNEE ARTHROPLASTY  09/08/2012   Procedure: TOTAL KNEE ARTHROPLASTY;  Surgeon: Rudean Haskell, MD;  Location: [facility];  Service: Orthopedics;  Laterality: Left;  left total knee arthroplasty    No Known Allergies  Current Outpatient Prescriptions  Medication Sig Dispense Refill  . allopurinol (ZYLOPRIM) 300 MG tablet Take 1 tablet by mouth daily.    Marland Kitchen aspirin 81 MG tablet Take 81 mg by mouth daily.    . Cholecalciferol (VITAMIN D3) 5000 units CAPS Take 1 capsule by mouth daily.    Marland Kitchen gabapentin (NEURONTIN) 600 MG tablet Take 1,200 mg by mouth daily.    . metFORMIN (GLUCOPHAGE) 500 MG tablet Take 1 tablet by mouth 2 (two) times daily.    . metoprolol succinate (TOPROL-XL) 50 MG 24 hr tablet Take 1 tablet (50 mg total) by mouth daily. Take with or immediately following a meal. 30 tablet 11  . NAMENDA XR 28 MG CP24 24 hr capsule Take 1 capsule by mouth daily.    . Omega-3 Fatty Acids (FISH OIL) 1000 MG CAPS Take 1 capsule by mouth daily.    Marland Kitchen  omeprazole (PRILOSEC) 20 MG capsule Take 20 mg by mouth daily.    Marland Kitchen oxyCODONE (OXY IR/ROXICODONE) 5 MG immediate release tablet Take 1-2 tablets (5-10 mg total) by mouth every 4 (four) hours as needed. 90 tablet 0  . potassium chloride SA (K-DUR,KLOR-CON) 20 MEQ tablet Take 1 tablet (20 mEq total) by mouth 2 (two) times daily. NEED OV. 60 tablet 0  . quinapril-hydrochlorothiazide (ACCURETIC) 20-25 MG per tablet Take 1 tablet by mouth daily.    . rosuvastatin (CRESTOR) 5 MG tablet Take 1 tablet by mouth daily.    Marland Kitchen testosterone cypionate (DEPOTESTOSTERONE CYPIONATE) 200 MG/ML injection Inject 200 mg into the muscle every 14 (fourteen) days.    Marland Kitchen torsemide (DEMADEX) 20 MG tablet Take 2 tablets (40 mg total) by mouth 2 (two) times daily.  NEED OV. 120 tablet 0   No current facility-administered medications for this visit.     Social History   Social History  . Marital status: Married    Spouse name: N/A  . Number of children: N/A  . Years of education: N/A   Occupational History  . Not on file.   Social History Main Topics  . Smoking status: Former Smoker    Packs/day: 1.00    Years: 12.00    Types: Cigarettes  . Smokeless tobacco: Former Systems developer    Types: Chew  . Alcohol use No  . Drug use: No  . Sexual activity: Not on file   Other Topics Concern  . Not on file   Social History Narrative  . No narrative on file   Socially, he is widowed. There is no tobacco history or alcohol. He completed 10th grade education. He is retired per he does use a cane.  No family history on file.  ROS General: Negative; No fevers, chills, or night sweats;  HEENT: Negative; No changes in vision or hearing, sinus congestion, difficulty swallowing Pulmonary: Negative; No cough, wheezing, shortness of breath, hemoptysis Cardiovascular: Negative; No chest pain, presyncope, syncope, palpatations Occasional lower extremity swelling. GI: Positive for GERD; No nausea, vomiting, diarrhea, or abdominal pain GU: Negative; No dysuria, hematuria, or difficulty voiding Musculoskeletal: Arthritic symptoms of the shoulders and hips  Hematologic/Oncology: Negative; no easy bruising, bleeding Endocrine: Positive for diabetes mellitus Neuro: Negative; no changes in balance, headaches Skin: Negative; No rashes or skin lesions Psychiatric: Negative; No behavioral problems, depression Sleep: Negative; No snoring, daytime sleepiness, hypersomnolence, bruxism, restless legs, hypnogognic hallucinations, no cataplexy Other comprehensive 14 point system review is negative.   PE BP (!) 124/58   Pulse 78   Ht '5\' 8"'$  (1.727 m)   Wt 224 lb (101.6 kg)   BMI 34.06 kg/m    Repeat blood pressure by me was 138/64  Wt Readings from Last 3  Encounters:  03/28/17 224 lb (101.6 kg)  02/06/16 209 lb 4.8 oz (94.9 kg)  12/06/14 231 lb 4.8 oz (104.9 kg)   General: Alert, oriented, no distress.  Skin: normal turgor, no rashes HEENT: Normocephalic, atraumatic. Pupils round and reactive; sclera anicteric;no lid lag.  There is a lesion on his skin underneath his right eye which has had difficulty healing. Nose without nasal septal hypertrophy Mouth/Parynx benign; Mallinpatti scale 3 Neck: No JVD, no carotid bruits with normal carotid upstroke. Lungs: clear to ausculatation and percussion; no wheezing or rales Chest wall: Nontender to palpation Heart: RRR, s1 s2 normal; 1/6 systolic murmur left sternal border.  No S3 or S4 gallop.  No diastolic murmur.  No rubs, thrills or heaves.  Abdomen: Large diastases recti; soft, nontender; no hepatosplenomehaly, BS+; abdominal aorta nontender and not dilated by palpation. Back: No CVA tenderness Pulses 2+ Extremities: Significant improvement in previous lower extremity edema, trace at his ankles bilaterally ; no clubbing cyanosis, Homan's sign negative  Neurologic: grossly nonfocal Psychologic: normal affect and mood.  ECG (independently read by me): Sinus rhythm at 70 bpm.  PAC.  Right bundle branch block with repolarization changes.  Q waves in lead 3 and aVF.  March 2017 ECG (independently read by me): Normal sinus rhythm at 87 bpm.  Right bundle branch block with repolarization changes.  Left axis deviation.  LVH voltage criteria.  Inferior Q waves , more prominent.  ECG (independently read by me): Sinus rhythm at 64 bpm.  Right bundle branch block with repolarization changes.  QTc interval 497 ms.  04/30/2014 ECG (independently read by me): Normal sinus rhythm at 73 beats per minute with right bundle branch block with repolarization changes.  QTc interval 467 ms.  Prior ECG: Normal sinus rhythm with previously noted right bundle branch block at 83 beats per minute. He noted inferior Q waves  in III and F.  LABS:  BMP Latest Ref Rng & Units 09/10/2012 09/09/2012 09/01/2012  Glucose 70 - 99 mg/dL 111(H) 146(H) 207(H)  BUN 6 - 23 mg/dL '10 13 13  '$ Creatinine 0.50 - 1.35 mg/dL 0.83 0.87 0.76  Sodium 135 - 145 mEq/L 139 135 138  Potassium 3.5 - 5.1 mEq/L 3.8 3.4(L) 3.4(L)  Chloride 96 - 112 mEq/L 104 100 98  CO2 19 - 32 mEq/L '28 29 30  '$ Calcium 8.4 - 10.5 mg/dL 8.3(L) 7.9(L) 9.1   Hepatic Function Latest Ref Rng & Units 09/01/2012 09/29/2008  Total Protein 6.0 - 8.3 g/dL 6.6 5.9(L)  Albumin 3.5 - 5.2 g/dL 3.6 3.7  AST 0 - 37 U/L 23 18  ALT 0 - 53 U/L 24 15  Alk Phosphatase 39 - 117 U/L 64 55  Total Bilirubin 0.3 - 1.2 mg/dL 0.7 1.2   CBC Latest Ref Rng & Units 09/10/2012 09/09/2012 09/01/2012  WBC 4.0 - 10.5 K/uL 12.0(H) 9.2 12.0(H)  Hemoglobin 13.0 - 17.0 g/dL 12.6(L) 12.1(L) 15.6  Hematocrit 39.0 - 52.0 % 37.4(L) 36.5(L) 44.9  Platelets 150 - 400 K/uL 174 140(L) 196   Lab Results  Component Value Date   MCV 86.4 09/10/2012   MCV 85.3 09/09/2012   MCV 84.6 09/01/2012   No results found for: TSH  Lipid Panel  No results found for: CHOL, TRIG, HDL, CHOLHDL, VLDL, LDLCALC, LDLDIRECT    RADIOLOGY: No results found.  IMPRESSION:  1. Coronary artery disease involving native coronary artery of native heart without angina pectoris   2. Hypertensive heart disease without heart failure   3. RBBB   4. Hyperlipidemia with target LDL less than 70   5. Bilateral lower extremity edema   6. Class 1 obesity due to excess calories without serious comorbidity with body mass index (BMI) of 34.0 to 34.9 in adult     ASSESSMENT AND PLAN: [name] is an 83 year old gentleman who has a history of mild nonobstructive CAD noted at cardiac catheterization in 2006 with approximately 20% LAD smooth narrowing proximally.  He denies any anginal symptoms.  His blood pressure today is well controlled on quinapril HCT 20/25 and Toprol-XL 50 mg daily.  He has experienced occasional swelling  of his ankles, but his peripheral edema is significantly improved on torsemide 40 mg twice a day.  He no longer is  on pravastatin and is now on Crestor 5 mg for hyperlipidemia with target LDL less than 70.  He underwent a four-year follow-up echo Doppler study in April 2017 which showed hypokinesis of the basal inferolateral wall with overall preserved LV function.  There was grade 1 diastolic dysfunction with elevation of filling pressures.  There was mild AI, mild biatrial enlargement, trace TR, and mild elevation of pulmonary pressures.  There is a lesion below his right eye, which has had recurrent bleeding and scabbing.  I have suggested dermatologic evaluation.  He sees Dr. Heide Scales at [facility] who has checked laboratory.  I will try to obtain these results.  He is obese with a BMI of 34.06.  Weight loss was recommended.  I will see him in 6 months for reevaluation  Time spent: 25 minutes   Troy Sine, MD, [facility]  03/28/2017 7:49 PM

## 2017-04-09 ENCOUNTER — Other Ambulatory Visit: Payer: Self-pay | Admitting: Cardiovascular Disease

## 2017-04-09 NOTE — Telephone Encounter (Signed)
REFILL 

## 2017-04-15 ENCOUNTER — Other Ambulatory Visit: Payer: Self-pay | Admitting: Cardiovascular Disease

## 2017-04-15 NOTE — Telephone Encounter (Signed)
Rx request sent to pharmacy.  

## 2017-04-30 ENCOUNTER — Telehealth: Payer: Self-pay | Admitting: *Deleted

## 2017-04-30 NOTE — Telephone Encounter (Signed)
Left lab result recommendations on voice mail. ( ok per DPR) call if questions or concerns.

## 2017-04-30 NOTE — Telephone Encounter (Signed)
-----   Message from Troy Sine, MD sent at 04/29/2017  7:39 AM EDT ----- Vitamin D3 is 21, if not taking already, initiate over-the-counter vitamin D 2000 units daily

## 2017-10-11 ENCOUNTER — Ambulatory Visit (INDEPENDENT_AMBULATORY_CARE_PROVIDER_SITE_OTHER): Payer: Medicare Other | Admitting: Cardiovascular Disease

## 2017-10-11 ENCOUNTER — Encounter: Payer: Self-pay | Admitting: Cardiovascular Disease

## 2017-10-11 VITALS — BP 114/56 | HR 77 | Ht 69.0 in | Wt 222.0 lb

## 2017-10-11 DIAGNOSIS — I2510 Atherosclerotic heart disease of native coronary artery without angina pectoris: Secondary | ICD-10-CM | POA: Diagnosis not present

## 2017-10-11 DIAGNOSIS — E785 Hyperlipidemia, unspecified: Secondary | ICD-10-CM

## 2017-10-11 DIAGNOSIS — E1142 Type 2 diabetes mellitus with diabetic polyneuropathy: Secondary | ICD-10-CM | POA: Diagnosis not present

## 2017-10-11 DIAGNOSIS — Z6834 Body mass index (BMI) 34.0-34.9, adult: Secondary | ICD-10-CM | POA: Diagnosis not present

## 2017-10-11 DIAGNOSIS — R600 Localized edema: Secondary | ICD-10-CM | POA: Diagnosis not present

## 2017-10-11 DIAGNOSIS — E6609 Other obesity due to excess calories: Secondary | ICD-10-CM | POA: Diagnosis not present

## 2017-10-11 NOTE — Progress Notes (Signed)
Patient ID: [name], male   DOB: 08/26/1934, 83 y.o.   MRN: [id number]     Primary:  Heide Scales, PA-C  HPI: [name] is a 83 y.o. male who presents to the office today for a 6 month cardiology evaluation.  [name]  has documented mild CAD by catheterization in 2006 which has been treated medically.   Additional problems include hypertension, type 2 diabetes mellitus, hyperlipidemia, obesity, and  peripheral neuropathy.  He underwent knee replacement surgery in 2013 well without cardiovascular compromise. An echo Doppler study  showed an EF of 55% with mild LVH, mild LA dilatation, mild to moderate mitral annular calcification trace MR, and mild pulmonary hypertension with mild TR with estimated pressure 32 mm. There was aortic sclerosis without stenosis with mild aortic insufficiency and mild pulmonic insufficiency.  Since I last saw him in May 2018.  He denies any recurrent episodes of chest tightness.  He has chronic right bundle branch block.  He is unaware of palpitations.  He is on fish oil and Crestor 5 mg for hyperlipidemia.  He is diabetic on metformin.  He continues to take torsemide 20 oh grams twice a day, both for his blood pressure and ankle edema and is on Toprol-XL 50 mg daily in addition to quinapril HCT 20/25 mg for hypertension control.  He has peripheral neuropathy on Neurontin.  He testosterone injections from his primary physician at 2 week intervals.  He has arthritis of the shoulders and hips and walks with a cane.  He is on Namenda for memory.  He presents for evaluation   Past Medical History:  Diagnosis Date  . Arthritis   . Cancer ([place])    skin cancer,melonoma on nose  . Diabetes mellitus   . GERD (gastroesophageal reflux disease)   . Hyperlipidemia   . Hypertension   . Myocardial infarction ([place])    1990  . Neuromuscular disorder ([place])    neuropathy    Past Surgical History:  Procedure Laterality Date  . BACK SURGERY    . CERVICAL  LAMINECTOMY    . JOINT REPLACEMENT     right knee  . PROSTATE SURGERY    . ROTATOR CUFF REPAIR    . SKIN CANCER EXCISION     eyelid and melonoma on nose  . TOTAL KNEE ARTHROPLASTY Left 09/08/2012   Performed by Vickey Huger, MD at [facility] OR    No Known Allergies  Current Outpatient Medications  Medication Sig Dispense Refill  . allopurinol (ZYLOPRIM) 300 MG tablet Take 1 tablet by mouth daily.    Marland Kitchen aspirin 81 MG tablet Take 81 mg by mouth daily.    . Cholecalciferol (VITAMIN D3) 5000 units CAPS Take 1 capsule by mouth daily.    Marland Kitchen gabapentin (NEURONTIN) 600 MG tablet Take 1,200 mg by mouth daily.    . metFORMIN (GLUCOPHAGE) 500 MG tablet Take 1 tablet by mouth 2 (two) times daily.    . metoprolol succinate (TOPROL-XL) 50 MG 24 hr tablet Take 1 tablet (50 mg total) by mouth daily. Take with or immediately following a meal. 30 tablet 11  . NAMENDA XR 28 MG CP24 24 hr capsule Take 1 capsule by mouth daily.    . Omega-3 Fatty Acids (FISH OIL) 1000 MG CAPS Take 1 capsule by mouth daily.    Marland Kitchen omeprazole (PRILOSEC) 20 MG capsule Take 20 mg by mouth daily.    Marland Kitchen oxyCODONE (OXY IR/ROXICODONE) 5 MG immediate release tablet Take 1-2 tablets (5-10 mg  total) by mouth every 4 (four) hours as needed. 90 tablet 0  . potassium chloride SA (K-DUR,KLOR-CON) 20 MEQ tablet TAKE ONE TABLET BY MOUTH TWICE DAILY 60 tablet 11  . quinapril-hydrochlorothiazide (ACCURETIC) 20-25 MG per tablet Take 1 tablet by mouth daily.    . rosuvastatin (CRESTOR) 5 MG tablet Take 1 tablet by mouth daily.    Marland Kitchen testosterone cypionate (DEPOTESTOSTERONE CYPIONATE) 200 MG/ML injection Inject 200 mg into the muscle every 14 (fourteen) days.    Marland Kitchen torsemide (DEMADEX) 20 MG tablet Take 2 tablets (40 mg total) by mouth 2 (two) times daily. 120 tablet 6   No current facility-administered medications for this visit.     Social History   Socioeconomic History  . Marital status: Married    Spouse name: Not on file  . Number of children:  Not on file  . Years of education: Not on file  . Highest education level: Not on file  Social Needs  . Financial resource strain: Not on file  . Food insecurity - worry: Not on file  . Food insecurity - inability: Not on file  . Transportation needs - medical: Not on file  . Transportation needs - non-medical: Not on file  Occupational History  . Not on file  Tobacco Use  . Smoking status: Former Smoker    Packs/day: 1.00    Years: 12.00    Pack years: 12.00    Types: Cigarettes  . Smokeless tobacco: Former Systems developer    Types: Chew  Substance and Sexual Activity  . Alcohol use: No  . Drug use: No  . Sexual activity: Not on file  Other Topics Concern  . Not on file  Social History Narrative  . Not on file   Socially, he is widowed. There is no tobacco history or alcohol. He completed 10th grade education. He is retired per he does use a cane.  No family history on file.  ROS General: Negative; No fevers, chills, or night sweats;  HEENT: Negative; No changes in vision or hearing, sinus congestion, difficulty swallowing Pulmonary: Negative; No cough, wheezing, shortness of breath, hemoptysis Cardiovascular: Negative; No chest pain, presyncope, syncope, palpatations Occasional lower extremity swelling. GI: Positive for GERD; No nausea, vomiting, diarrhea, or abdominal pain GU: Negative; No dysuria, hematuria, or difficulty voiding Musculoskeletal: Arthritic symptoms of the shoulders and hips  Hematologic/Oncology: Negative; no easy bruising, bleeding Endocrine: Positive for diabetes mellitus Neuro: Negative; no changes in balance, headaches Skin: Negative; No rashes or skin lesions Psychiatric: Negative; No behavioral problems, depression Sleep: Negative; No snoring, daytime sleepiness, hypersomnolence, bruxism, restless legs, hypnogognic hallucinations, no cataplexy Other comprehensive 14 point system review is negative.   PE BP (!) 114/56   Pulse 77   Ht '5\' 9"'$  (1.753 m)    Wt 222 lb (100.7 kg)   BMI 32.78 kg/m    Repeat blood pressure by me 124/68  Wt Readings from Last 3 Encounters:  10/11/17 222 lb (100.7 kg)  03/28/17 224 lb (101.6 kg)  02/06/16 209 lb 4.8 oz (94.9 kg)   General: Alert, oriented, no distress.  Skin: normal turgor, no rashes, warm and dry HEENT: Normocephalic, atraumatic. Pupils equal round and reactive to light; sclera anicteric; extraocular muscles intact;  Nose without nasal septal hypertrophy Mouth/Parynx benign; Mallinpatti scale 3 Neck: No JVD, no carotid bruits; normal carotid upstroke Lungs: clear to ausculatation and percussion; no wheezing or rales Chest wall: without tenderness to palpitation Heart: PMI not displaced, RRR, s1 s2 normal, 1/6 systolic murmur,  no diastolic murmur, no rubs, gallops, thrills, or heaves Abdomen: soft, nontender; no hepatosplenomehaly, BS+; abdominal aorta nontender and not dilated by palpation. Back: no CVA tenderness Pulses 2+ Musculoskeletal: full range of motion, normal strength, no joint deformities Extremities: Trace bilateral ankle edema,no clubbing, cyanosis, Homan's sign negative  Neurologic: grossly nonfocal; Cranial nerves grossly wnl Psychologic: Normal mood and affect   ECG (independently read by me): Normal sinus rhythm with isolated PAC.  Right bundle branch block with repolarization changes.  Inferior Q-wave  May 2018 ECG (independently read by me): Sinus rhythm at 70 bpm.  PAC.  Right bundle branch block with repolarization changes.  Q waves in lead 3 and aVF.  March 2017 ECG (independently read by me): Normal sinus rhythm at 87 bpm.  Right bundle branch block with repolarization changes.  Left axis deviation.  LVH voltage criteria.  Inferior Q waves , more prominent.  ECG (independently read by me): Sinus rhythm at 64 bpm.  Right bundle branch block with repolarization changes.  QTc interval 497 ms.  04/30/2014 ECG (independently read by me): Normal sinus rhythm at 73  beats per minute with right bundle branch block with repolarization changes.  QTc interval 467 ms.  Prior ECG: Normal sinus rhythm with previously noted right bundle branch block at 83 beats per minute. He noted inferior Q waves in III and F.  LABS: Recent outpatient laboratory from [facility] from April 2018 was reviewed.  The patient will be undergoing repeat laboratory with his primary physician in the near future.  BMP Latest Ref Rng & Units 09/10/2012 09/09/2012 09/01/2012  Glucose 70 - 99 mg/dL 111(H) 146(H) 207(H)  BUN 6 - 23 mg/dL '10 13 13  '$ Creatinine 0.50 - 1.35 mg/dL 0.83 0.87 0.76  Sodium 135 - 145 mEq/L 139 135 138  Potassium 3.5 - 5.1 mEq/L 3.8 3.4(L) 3.4(L)  Chloride 96 - 112 mEq/L 104 100 98  CO2 19 - 32 mEq/L '28 29 30  '$ Calcium 8.4 - 10.5 mg/dL 8.3(L) 7.9(L) 9.1   Hepatic Function Latest Ref Rng & Units 09/01/2012 09/29/2008  Total Protein 6.0 - 8.3 g/dL 6.6 5.9(L)  Albumin 3.5 - 5.2 g/dL 3.6 3.7  AST 0 - 37 U/L 23 18  ALT 0 - 53 U/L 24 15  Alk Phosphatase 39 - 117 U/L 64 55  Total Bilirubin 0.3 - 1.2 mg/dL 0.7 1.2   CBC Latest Ref Rng & Units 09/10/2012 09/09/2012 09/01/2012  WBC 4.0 - 10.5 K/uL 12.0(H) 9.2 12.0(H)  Hemoglobin 13.0 - 17.0 g/dL 12.6(L) 12.1(L) 15.6  Hematocrit 39.0 - 52.0 % 37.4(L) 36.5(L) 44.9  Platelets 150 - 400 K/uL 174 140(L) 196   Lab Results  Component Value Date   MCV 86.4 09/10/2012   MCV 85.3 09/09/2012   MCV 84.6 09/01/2012   No results found for: TSH  Lipid Panel  No results found for: CHOL, TRIG, HDL, CHOLHDL, VLDL, LDLCALC, LDLDIRECT   RADIOLOGY: No results found.  IMPRESSION:  1. Coronary artery disease involving native coronary artery of native heart without angina pectoris   2. Hyperlipidemia with target LDL less than 70   3. Class 1 obesity due to excess calories without serious comorbidity with body mass index (BMI) of 34.0 to 34.9 in adult   4. Type 2 diabetes mellitus with diabetic polyneuropathy, without long-term  current use of insulin ([place])   5. Bilateral leg edema     ASSESSMENT AND PLAN: [name] is an 83 year old gentleman who has a history of mild nonobstructive  CAD noted at cardiac catheterization in 2006 with approximately 20% LAD smooth narrowing proximally.  He denies any anginal symptoms.  His walking is limited due to arthritic symptoms and he walks with a cane.  Pressure today is stable on quinapril HCT 20/25 mg in addition to metoprolol XL 50 [place] grams daily.  His ECG remained stable and he has chronic right bundle branch block which is unchanged.  There was one PAC.  He has been on rosuvastatin 5 mg in addition to omega-3 fatty acids.  I reviewed recent laboratory and his LDL cholesterol was excellent at 34 with triglycerides 74, cholesterol 78.  His glucose was elevated at 174.  He is diabetic on metformin 500 mg twice a day.  Apparently he has been undergoing every 2 week testosterone injections.  Testosterone level was 829.  It may be possible to decrease his dosing but this is prescribed by his primary physician.  He takes Environmental manager.  He has not had any recent gout and continues to take allopurinol.  He has a peripheral neuropathy which is improved with gabapentin.  His edema is improved with torsemide 20 [place] grams twice a day, although trace edema persist today.  He tells me his primary physician checks them regularly.  As long as he remains cardiac stable, I will see him in one year for cardiology reevaluation.   Time spent: 25 minutes  Troy Sine, MD, [facility]  10/11/2017 5:25 PM

## 2017-10-11 NOTE — Patient Instructions (Signed)

## 2017-11-08 ENCOUNTER — Other Ambulatory Visit: Payer: Self-pay | Admitting: Cardiovascular Disease

## 2017-12-13 DIAGNOSIS — E860 Dehydration: Secondary | ICD-10-CM

## 2017-12-13 DIAGNOSIS — I5022 Chronic systolic (congestive) heart failure: Secondary | ICD-10-CM

## 2017-12-13 DIAGNOSIS — R296 Repeated falls: Secondary | ICD-10-CM | POA: Diagnosis not present

## 2017-12-13 DIAGNOSIS — G9341 Metabolic encephalopathy: Secondary | ICD-10-CM | POA: Diagnosis not present

## 2017-12-13 DIAGNOSIS — N179 Acute kidney failure, unspecified: Secondary | ICD-10-CM | POA: Diagnosis not present

## 2017-12-13 DIAGNOSIS — M6282 Rhabdomyolysis: Secondary | ICD-10-CM | POA: Diagnosis not present

## 2017-12-14 DIAGNOSIS — R296 Repeated falls: Secondary | ICD-10-CM | POA: Diagnosis not present

## 2017-12-14 DIAGNOSIS — I5022 Chronic systolic (congestive) heart failure: Secondary | ICD-10-CM | POA: Diagnosis not present

## 2017-12-14 DIAGNOSIS — G9341 Metabolic encephalopathy: Secondary | ICD-10-CM | POA: Diagnosis not present

## 2017-12-14 DIAGNOSIS — E860 Dehydration: Secondary | ICD-10-CM | POA: Diagnosis not present

## 2017-12-14 DIAGNOSIS — R55 Syncope and collapse: Secondary | ICD-10-CM | POA: Diagnosis not present

## 2017-12-14 DIAGNOSIS — N179 Acute kidney failure, unspecified: Secondary | ICD-10-CM | POA: Diagnosis not present

## 2017-12-14 DIAGNOSIS — M6282 Rhabdomyolysis: Secondary | ICD-10-CM | POA: Diagnosis not present

## 2017-12-15 DIAGNOSIS — R55 Syncope and collapse: Secondary | ICD-10-CM | POA: Diagnosis not present

## 2017-12-15 DIAGNOSIS — E860 Dehydration: Secondary | ICD-10-CM | POA: Diagnosis not present

## 2017-12-15 DIAGNOSIS — N179 Acute kidney failure, unspecified: Secondary | ICD-10-CM | POA: Diagnosis not present

## 2017-12-15 DIAGNOSIS — M6282 Rhabdomyolysis: Secondary | ICD-10-CM | POA: Diagnosis not present

## 2017-12-15 DIAGNOSIS — R296 Repeated falls: Secondary | ICD-10-CM | POA: Diagnosis not present

## 2017-12-15 DIAGNOSIS — G9341 Metabolic encephalopathy: Secondary | ICD-10-CM | POA: Diagnosis not present

## 2017-12-15 DIAGNOSIS — I5022 Chronic systolic (congestive) heart failure: Secondary | ICD-10-CM | POA: Diagnosis not present

## 2017-12-16 DIAGNOSIS — I5022 Chronic systolic (congestive) heart failure: Secondary | ICD-10-CM | POA: Diagnosis not present

## 2017-12-16 DIAGNOSIS — R296 Repeated falls: Secondary | ICD-10-CM | POA: Diagnosis not present

## 2017-12-16 DIAGNOSIS — N179 Acute kidney failure, unspecified: Secondary | ICD-10-CM | POA: Diagnosis not present

## 2017-12-16 DIAGNOSIS — G9341 Metabolic encephalopathy: Secondary | ICD-10-CM | POA: Diagnosis not present

## 2017-12-29 NOTE — Progress Notes (Signed)
Cardiology Office Note   Date:  12/30/2017   ID:  [name], DOB Jan 19, 1934, MRN [id number]  PCP:  Imagene Riches, NP  Cardiologist:  Dr. Claiborne Billings  Chief Complaint  Patient presents with  . Hospitalization Follow-up     History of Present Illness: [name] is a 84 y.o. male who presents for ongoing assessment and management of CAD with non-obstructive disease. He was last seen by Dr. Claiborne Billings on 11/16.2018. Other history includes hypertension, Type II diabetes, hyperlipidemia, obesity, arthritis, and peripheral neuropathy. He was to follow up in one year.  Unfortunately, the patient was admitted to [facility], aspirin, on 12/13/2017, in the setting of dehydration, kidney failure, with 3 falls prior to admission. The patient was also found to be in atrial fibrillation with RVR. The patient was started on ELIQUIS 5 mg twice a day, (creatinine 1.10). Also metoprolol dose was increased from 50 mg daily 200 mg daily. The patient gabapentin decrease in dose due to multiple falls. Was also found to have acute metabolic encephalopathy in the setting of hypercalcemia, and dehydration.  Incidentally, the patient was found to have liver steatosis, NASH was suspected per CT, lung nodule 4 mm with incidentally found on CT of the neck. Follow-up labs for primary care or planned to include hep C antibody and hep B sAg.  Echocardiogram revealed ejection fraction of 40%, previous EF was greater than 55%. Suspected ischemic. The patient has not had a follow-up stress Myoview ordered due to overall deconditioning at this point.  He comes today stating that he is breathing better, healing overall more energized, denies chest pain, palpitations, or bleeding. He is medically compliant. He states his blood sugar ranges between 115 and 180, but normally runs closer to the 115 range predominantly.  Discharge labs:  Hemoglobin 13.0, hematocrit 40.0, white blood cells 7.4, platelets 214 Sodium 135,  potassium 3.8, chloride 103, CO2 30, BUN 26, creatinine 1.10, glucose 225.  Past Medical History:  Diagnosis Date  . Arthritis   . Cancer ([place])    skin cancer,melonoma on nose  . Diabetes mellitus   . GERD (gastroesophageal reflux disease)   . Hyperlipidemia   . Hypertension   . Myocardial infarction ([place])    1990  . Neuromuscular disorder ([place])    neuropathy    Past Surgical History:  Procedure Laterality Date  . BACK SURGERY    . CERVICAL LAMINECTOMY    . JOINT REPLACEMENT     right knee  . PROSTATE SURGERY    . ROTATOR CUFF REPAIR    . SKIN CANCER EXCISION     eyelid and melonoma on nose  . TOTAL KNEE ARTHROPLASTY  09/08/2012   Procedure: TOTAL KNEE ARTHROPLASTY;  Surgeon: Rudean Haskell, MD;  Location: [place];  Service: Orthopedics;  Laterality: Left;  left total knee arthroplasty     Current Outpatient Medications  Medication Sig Dispense Refill  . allopurinol (ZYLOPRIM) 300 MG tablet Take 1 tablet by mouth daily.    Marland Kitchen ELIQUIS 5 MG TABS tablet Take 1 tablet (5 mg total) by mouth 2 (two) times daily. 60 tablet 6  . escitalopram (LEXAPRO) 10 MG tablet Take 1 tablet by mouth daily.    Marland Kitchen gabapentin (NEURONTIN) 400 MG capsule Take 2 capsules by mouth daily.    . Magnesium Oxide 400 MG CAPS Take 400 mg by mouth daily.    . metFORMIN (GLUCOPHAGE) 1000 MG tablet Take 1,000 mg by mouth 2 (two) times daily with a meal.    .  metoprolol succinate (TOPROL-XL) 100 MG 24 hr tablet Take 1 tablet (100 mg total) by mouth daily. 30 tablet 6  . Multiple Vitamins-Minerals (ICAPS PO) Take by mouth.    Marland Kitchen NAMENDA XR 28 MG CP24 24 hr capsule Take 1 capsule by mouth daily.    Marland Kitchen omeprazole (PRILOSEC) 20 MG capsule Take 20 mg by mouth daily.    Marland Kitchen oxyCODONE (OXY IR/ROXICODONE) 5 MG immediate release tablet Take 1-2 tablets (5-10 mg total) by mouth every 4 (four) hours as needed. 90 tablet 0  . potassium chloride SA (K-DUR,KLOR-CON) 20 MEQ tablet TAKE ONE TABLET BY MOUTH TWICE DAILY 60 tablet 11    . quinapril (ACCUPRIL) 20 MG tablet Take 1 tablet (20 mg total) by mouth every evening. 30 tablet 6  . testosterone cypionate (DEPOTESTOSTERONE CYPIONATE) 200 MG/ML injection Inject 200 mg into the muscle every 14 (fourteen) days.    Marland Kitchen torsemide (DEMADEX) 20 MG tablet TAKE TWO (2) TABLETS BY MOUTH 2 TIMES DAILY 120 tablet 6   No current facility-administered medications for this visit.     Allergies:   Patient has no known allergies.    Social History:  The patient  reports that he has quit smoking. His smoking use included cigarettes. He has a 12.00 pack-year smoking history. He has quit using smokeless tobacco. His smokeless tobacco use included chew. He reports that he does not drink alcohol or use drugs.   Family History:  The patient's family history is not on file.    ROS: All other systems are reviewed and negative. Unless otherwise mentioned in H&P    PHYSICAL EXAM: VS:  BP 115/68   Pulse 95   Ht 5\' 9"  (1.753 m)   Wt 223 lb 6.4 oz (101.3 kg)   BMI 32.99 kg/m  , BMI Body mass index is 32.99 kg/m. GEN: Well nourished, well developed, in no acute distress  HEENT: normal  Neck: no JVD, carotid bruits, or masses Cardiac: IRRR; no murmurs, rubs, or gallops,non-pitting pretibial edema on the left, 1+ pitting pretibial edema on the right.  Respiratory:  clear to auscultation bilaterally, normal work of breathing GI: soft, nontender, nondistended, + BS, mildly obese MS: no deformity or atrophy  Skin: warm and dry, no rash Neuro:  Strength and sensation are intact Psych: euthymic mood, full affect   EKG:  Atrial fibrillation, rate of 95 bpm, right bundle branch block.   Recent Labs: No results found for requested labs within last 8760 hours.    Lipid Panel No results found for: CHOL, TRIG, HDL, CHOLHDL, VLDL, LDLCALC, LDLDIRECT    Wt Readings from Last 3 Encounters:  12/30/17 223 lb 6.4 oz (101.3 kg)  10/11/17 222 lb (100.7 kg)  03/28/17 224 lb (101.6 kg)       Other studies Reviewed: Echocardiogram-01/06/18 ( transcribed from discharge summary)  Left atrial enlargement, mild left ventricular chamber enlargement with global decrease in systolic function, EF 32%. Normal right heart structures, RVSP 40 mm. Trileaflet aortic valve without stenosis and with mild insufficiency. Mitral annular calcification. Mild mitral regurg. No pericardial effusion.   ASSESSMENT AND PLAN:  1.  New onset atrial fibrillation: Diagnosed during recent hospitalization January 2019 at [facility]. Metoprolol dose is increased to 100 mg daily, he was started on ELIQUIS 5 mg twice a day. Heart rate is essentially controlled. It is a little elevated today in the office, but I feel we can keep him not to metoprolol 100 mg dose for now.  2. HFrEF: Ejection fraction  reduced to 45% on recent hospitalization evaluation echocardiogram, prior ejection fraction 55%. Question ischemic cardiomyopathy. The patient denies any chest pain, dyspnea, or fatigue. Will defer to Dr. Claiborne Billings concerning need to do further ischemic testing in this elderly man with multiple medical problems. For now we'll continue to treat medically. Continue torsemide 20 mg daily with potassium supplement.  I explained to him and his daughter that he will need to continue daily weights, salt avoidance, and communication with our office should he gain weight greater than 3-5 pounds over 2 days. We will advise him need to increase his torsemide dose temporarily.  3. Hypertension: The patient will continue metoprolol succinate 100 mg daily, and quinapril 20 mg daily. I will repeat his labs prior to follow-up of point with Dr. Claiborne Billings.  4. Non-insulin-dependent diabetes: He remains on metformin 1000 mg twice a day. This was held temporarily during recent hospitalization due to AKI, but has since resumed on discharge. He will follow PCP for ongoing management.  Current medicines are reviewed at length with the patient  today.    Labs/ tests ordered today include: BMET   Phill Myron. West Pugh, ANP, AACC   12/30/2017 4:34 PM    [facility] [address] [place], [place] [address] Phone: [phone]; Fax: [phone]

## 2017-12-30 ENCOUNTER — Encounter (INDEPENDENT_AMBULATORY_CARE_PROVIDER_SITE_OTHER): Payer: Self-pay

## 2017-12-30 ENCOUNTER — Encounter: Payer: Self-pay | Admitting: Adult Health

## 2017-12-30 ENCOUNTER — Ambulatory Visit (INDEPENDENT_AMBULATORY_CARE_PROVIDER_SITE_OTHER): Payer: Medicare Other | Admitting: Adult Health

## 2017-12-30 VITALS — BP 115/68 | HR 95 | Ht 69.0 in | Wt 223.4 lb

## 2017-12-30 DIAGNOSIS — I482 Chronic atrial fibrillation, unspecified: Secondary | ICD-10-CM

## 2017-12-30 DIAGNOSIS — Z79899 Other long term (current) drug therapy: Secondary | ICD-10-CM | POA: Diagnosis not present

## 2017-12-30 DIAGNOSIS — I10 Essential (primary) hypertension: Secondary | ICD-10-CM | POA: Diagnosis not present

## 2017-12-30 DIAGNOSIS — E1142 Type 2 diabetes mellitus with diabetic polyneuropathy: Secondary | ICD-10-CM

## 2017-12-30 DIAGNOSIS — I43 Cardiomyopathy in diseases classified elsewhere: Secondary | ICD-10-CM | POA: Diagnosis not present

## 2017-12-30 MED ORDER — METOPROLOL SUCCINATE ER 100 MG PO TB24: 100.0000 mg | ORAL_TABLET | Freq: Every day | ORAL | 6 refills | Status: DC

## 2017-12-30 MED ORDER — QUINAPRIL HCL 20 MG PO TABS: 20.0000 mg | ORAL_TABLET | Freq: Every evening | ORAL | 6 refills | Status: DC

## 2017-12-30 MED ORDER — ELIQUIS 5 MG PO TABS: 5.0000 mg | ORAL_TABLET | Freq: Two times a day (BID) | ORAL | 6 refills | Status: DC

## 2017-12-30 NOTE — Patient Instructions (Signed)
Medication Instructions:  NO CHANGES-Your physician recommends that you continue on your current medications as directed. Please refer to the Current Medication list given to you today.  If you need a refill on your cardiac medications before your next appointment, please call your pharmacy.  Labwork: BMET AND CBC BEFORE TO REVIEW AT YOUR NEXT APPT WITH DR Claiborne Billings HERE IN OUR OFFICE AT [facility]  Take the provided lab slips for you to take with you to the lab for you blood draw.   You will need to fast. DO NOT EAT OR DRINK PAST MIDNIGHT.   You may go to any [facility] lab that is convenient for you however, we do have a lab in our office that is able to assist you. You do NOT need an appointment for our lab. Once in our office lobby there is a podium to the right of the check-in desk where you are to sign-in and ring a doorbell to alert [place] you are here. Lab is open Monday-Friday from 8:00am to 4:00pm; and is closed for lunch from 12:45p-1:45pm    Special Instructions: PLEASE GET YOUR LABWORK DONE A FEW DAYS BEFORE YOU FOLLOW UP APPT WITH DR Claiborne Billings.  Follow-Up: Your physician wants you to follow-up in: [place].  Thank you for choosing [facility] at [facility]!!

## 2018-01-06 ENCOUNTER — Telehealth: Payer: Self-pay | Admitting: Adult Health

## 2018-01-06 NOTE — Telephone Encounter (Signed)
New Message     *STAT* If patient is at the pharmacy, call can be transferred to refill team.   1. Which medications need to be refilled? (please list name of each medication and dose if known)  ELIQUIS 5 MG TABS tablet, metoprolol succinate (TOPROL-XL) 100 MG 24 hr tablet AND quinapril (ACCUPRIL) 20 MG tablet   2. Which pharmacy/location (including street and city if local pharmacy) is medication to be sent to? [facility]  3. Do they need a 30 day or 90 day supply? 30   Please resend refill request pharmacy does not have.

## 2018-01-07 NOTE — Telephone Encounter (Signed)
LEFT DETAILED MESSAGE-REFILLS SENT 12-30-17 ALREADY  CALLED PHARMACY-REFILL TOO SOON CAN PICK UP TOMORROW

## 2018-02-03 ENCOUNTER — Telehealth: Payer: Self-pay | Admitting: Cardiovascular Disease

## 2018-02-03 NOTE — Telephone Encounter (Signed)
New Message   Pt c/o swelling: STAT is pt has developed SOB within 24 hours  1) How much weight have you gained and in what time span? unknown  2) If swelling, where is the swelling located? Legs and feet   3) Are you currently taking a fluid pill? No  4) Are you currently SOB? No   Do you have a log of your daily weights (if so, list)? no 5) Have you gained 3 pounds in a day or 5 pounds in a week? unknown  6) Have you traveled recently? No

## 2018-02-03 NOTE — Telephone Encounter (Signed)
Agree with recommendation

## 2018-02-03 NOTE — Telephone Encounter (Signed)
Spoke to [employer]. PATIENT states swelling  Legs and feet over the weekend.  Have not weighed patient ,  Not sure if patient has taken medication correctly. Swelling goes down during the night.   Instructed restart wearing compression socks, and may take an extra demadex 20 mg tomorrow since it is so late in the evening. Call back if symptoms continue will defer to dr Claiborne Billings in further instructions

## 2018-02-27 ENCOUNTER — Ambulatory Visit: Payer: Medicare Other | Admitting: Physician Assistant

## 2018-02-27 DIAGNOSIS — J181 Lobar pneumonia, unspecified organism: Secondary | ICD-10-CM

## 2018-02-27 DIAGNOSIS — E860 Dehydration: Secondary | ICD-10-CM

## 2018-02-27 DIAGNOSIS — R509 Fever, unspecified: Secondary | ICD-10-CM | POA: Diagnosis not present

## 2018-02-27 DIAGNOSIS — I5023 Acute on chronic systolic (congestive) heart failure: Secondary | ICD-10-CM | POA: Diagnosis not present

## 2018-02-27 DIAGNOSIS — N179 Acute kidney failure, unspecified: Secondary | ICD-10-CM | POA: Diagnosis not present

## 2018-02-27 DIAGNOSIS — I130 Hypertensive heart and chronic kidney disease with heart failure and stage 1 through stage 4 chronic kidney disease, or unspecified chronic kidney disease: Secondary | ICD-10-CM

## 2018-02-27 DIAGNOSIS — I959 Hypotension, unspecified: Secondary | ICD-10-CM

## 2018-02-28 DIAGNOSIS — I5023 Acute on chronic systolic (congestive) heart failure: Secondary | ICD-10-CM | POA: Diagnosis not present

## 2018-02-28 DIAGNOSIS — J181 Lobar pneumonia, unspecified organism: Secondary | ICD-10-CM | POA: Diagnosis not present

## 2018-02-28 DIAGNOSIS — I959 Hypotension, unspecified: Secondary | ICD-10-CM | POA: Diagnosis not present

## 2018-02-28 DIAGNOSIS — N179 Acute kidney failure, unspecified: Secondary | ICD-10-CM | POA: Diagnosis not present

## 2018-02-28 DIAGNOSIS — I130 Hypertensive heart and chronic kidney disease with heart failure and stage 1 through stage 4 chronic kidney disease, or unspecified chronic kidney disease: Secondary | ICD-10-CM | POA: Diagnosis not present

## 2018-02-28 DIAGNOSIS — E860 Dehydration: Secondary | ICD-10-CM | POA: Diagnosis not present

## 2018-03-05 ENCOUNTER — Telehealth: Payer: Self-pay | Admitting: Cardiovascular Disease

## 2018-03-05 NOTE — Telephone Encounter (Signed)
New Message:    Pt's Leg,ankle and feet are swelling again.His feet ankles are blue,Pt has an appointment on Friday here with Dr Claiborne Billings.Daughter says she need to know what to do please.

## 2018-03-05 NOTE — Telephone Encounter (Signed)
Returned call to patient's daughter [name].She stated father was admitted to [facility] last week with CHF.Stated she noticed yesterday when she took off compression hose both feet and ankles turning blue.Stated slightly swollen.He is taking Lasix 3 tablets twice a day.She does not remember mg.He has appointment with Dr.Kelly this Fri 4/12 at 9:00 am.Stated he has appointment with PCP tomorrow.Advised to continue to monitor and keep appointments as planned.

## 2018-03-07 ENCOUNTER — Ambulatory Visit (INDEPENDENT_AMBULATORY_CARE_PROVIDER_SITE_OTHER): Payer: Medicare Other | Admitting: Cardiovascular Disease

## 2018-03-07 ENCOUNTER — Encounter: Payer: Self-pay | Admitting: Cardiovascular Disease

## 2018-03-07 VITALS — BP 122/66 | HR 82 | Ht 69.0 in | Wt 209.6 lb

## 2018-03-07 DIAGNOSIS — I2510 Atherosclerotic heart disease of native coronary artery without angina pectoris: Secondary | ICD-10-CM | POA: Diagnosis not present

## 2018-03-07 DIAGNOSIS — I10 Essential (primary) hypertension: Secondary | ICD-10-CM | POA: Diagnosis not present

## 2018-03-07 DIAGNOSIS — E785 Hyperlipidemia, unspecified: Secondary | ICD-10-CM | POA: Diagnosis not present

## 2018-03-07 DIAGNOSIS — Z79899 Other long term (current) drug therapy: Secondary | ICD-10-CM | POA: Diagnosis not present

## 2018-03-07 DIAGNOSIS — R600 Localized edema: Secondary | ICD-10-CM | POA: Diagnosis not present

## 2018-03-07 DIAGNOSIS — I481 Persistent atrial fibrillation: Secondary | ICD-10-CM | POA: Diagnosis not present

## 2018-03-07 DIAGNOSIS — I43 Cardiomyopathy in diseases classified elsewhere: Secondary | ICD-10-CM | POA: Diagnosis not present

## 2018-03-07 DIAGNOSIS — I4819 Other persistent atrial fibrillation: Secondary | ICD-10-CM

## 2018-03-07 MED ORDER — FUROSEMIDE 40 MG PO TABS: ORAL_TABLET | ORAL | 3 refills | Status: DC

## 2018-03-07 NOTE — Progress Notes (Signed)
Patient ID: [name], male   DOB: [birth year], 84 y.o.   MRN: [id number]     Primary:  Heide Scales, PA-C  HPI: [name] is a 84 y.o. male who presents to the office today for a 5 month cardiology evaluation.  [name]  has documented mild CAD by catheterization in 2006 which has been treated medically.   Additional problems include hypertension, type 2 diabetes mellitus, hyperlipidemia, obesity, and  peripheral neuropathy.  He underwent knee replacement surgery in 2013 well without cardiovascular compromise. An echo Doppler study  showed an EF of 55% with mild LVH, mild LA dilatation, mild to moderate mitral annular calcification trace MR, and mild pulmonary hypertension with mild TR with estimated pressure 32 mm. There was aortic sclerosis without stenosis with mild aortic insufficiency and mild pulmonic insufficiency.  When I last saw him in November 2018 he denied any  recurrent episodes of chest tightness.  He has chronic right bundle branch block.  He was unaware of palpitations.  He was on Crestor L and fish oil for hyperlipidemia, metformin for diabetes mellitus, and was taking torsemide 20 mg twice a day both for blood pressure and ankle edema in addition to Toprol-XL 50 mg and quinapril HCT 20/25 mg for hypertension control.  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  He was on Namenda for memory.   Since I last saw him, he was hospitalized at [facility] in January 2018 in the setting of dehydration and had experienced several falls.  He was found to be in atrial fibrillation with RVR.  He was started on Eliquis 5 mg twice a day.  His metoprolol dose was increased.  He was found to have liver steatosis.  An echo Doppler study showed an EF of 40% which was reduced from previously at 55%.  He apparently was rehospitalized with volume overload at [facility] overnight on February 27, 2018.  Was felt to have possible left lower lobe pneumonia  on chest x-ray in the emergency room.  Creatinine was 1.4.  He was felt to have acute on chronic systolic heart failure with EF around 45% and there was evidence for fluid overload.  He was placed on high-dose Lasix along with Zaroxolyn for 1 dose and additional medications were adjusted.  He was 1.9 at admission which improved.  He had follow-up lab work yesterday done at [facility] which now shows his creatinine at 1.05.  BNP was 367.  He is breathing better since his hospitalization.  He admits to an 11 pound weight loss.  He presents for reevaluation.  Past Medical History:  Diagnosis Date  . Arthritis   . Cancer ([place])    skin cancer,melonoma on nose  . Diabetes mellitus   . GERD (gastroesophageal reflux disease)   . Hyperlipidemia   . Hypertension   . Myocardial infarction ([place])    1990  . Neuromuscular disorder ([place])    neuropathy    Past Surgical History:  Procedure Laterality Date  . BACK SURGERY    . CERVICAL LAMINECTOMY    . JOINT REPLACEMENT     right knee  . PROSTATE SURGERY    . ROTATOR CUFF REPAIR    . SKIN CANCER EXCISION     eyelid and melonoma on nose  . TOTAL KNEE ARTHROPLASTY  09/08/2012   Procedure: TOTAL KNEE ARTHROPLASTY;  Surgeon: Rudean Haskell, MD;  Location: [place];  Service: Orthopedics;  Laterality: Left;  left total knee arthroplasty    No Known Allergies  Current Outpatient Medications  Medication Sig Dispense Refill  . allopurinol (ZYLOPRIM) 300 MG tablet Take 1 tablet by mouth daily.    Marland Kitchen ELIQUIS 5 MG TABS tablet Take 1 tablet (5 mg total) by mouth 2 (two) times daily. 60 tablet 6  . escitalopram (LEXAPRO) 10 MG tablet Take 1 tablet by mouth daily.    . furosemide (LASIX) 40 MG tablet Take 2 tablets (80 mg total) by mouth every morning AND 1.5 tablets (60 mg total) every evening. Take 3 tablets by mouth twice a day, daily. 315 tablet 3  . gabapentin (NEURONTIN) 400 MG capsule Take 2 capsules by mouth daily.    Marland Kitchen lisinopril  (PRINIVIL,ZESTRIL) 5 MG tablet Take 5 mg by mouth daily.     . Magnesium Oxide 400 MG CAPS Take 400 mg by mouth daily.    . metFORMIN (GLUCOPHAGE) 1000 MG tablet Take 1,000 mg by mouth 2 (two) times daily with a meal.    . metoprolol succinate (TOPROL-XL) 100 MG 24 hr tablet Take 1 tablet (100 mg total) by mouth daily. 30 tablet 6  . Multiple Vitamins-Minerals (ICAPS PO) Take by mouth.    Marland Kitchen NAMENDA XR 28 MG CP24 24 hr capsule Take 1 capsule by mouth daily.    Marland Kitchen omeprazole (PRILOSEC) 20 MG capsule Take 20 mg by mouth daily.    Marland Kitchen oxyCODONE (OXY IR/ROXICODONE) 5 MG immediate release tablet Take 1-2 tablets (5-10 mg total) by mouth every 4 (four) hours as needed. 90 tablet 0  . potassium chloride SA (K-DUR,KLOR-CON) 20 MEQ tablet TAKE ONE TABLET BY MOUTH TWICE DAILY 60 tablet 11  . testosterone cypionate (DEPOTESTOSTERONE CYPIONATE) 200 MG/ML injection Inject 200 mg into the muscle every 14 (fourteen) days.     No current facility-administered medications for this visit.     Social History   Socioeconomic History  . Marital status: Married    Spouse name: Not on file  . Number of children: Not on file  . Years of education: Not on file  . Highest education level: Not on file  Occupational History  . Not on file  Social Needs  . Financial resource strain: Not on file  . Food insecurity:    Worry: Not on file    Inability: Not on file  . Transportation needs:    Medical: Not on file    Non-medical: Not on file  Tobacco Use  . Smoking status: Former Smoker    Packs/day: 1.00    Years: 12.00    Pack years: 12.00    Types: Cigarettes  . Smokeless tobacco: Former Systems developer    Types: Chew  Substance and Sexual Activity  . Alcohol use: No  . Drug use: No  . Sexual activity: Not on file  Lifestyle  . Physical activity:    Days per week: Not on file    Minutes per session: Not on file  . Stress: Not on file  Relationships  . Social connections:    Talks on phone: Not on file    Gets  together: Not on file    Attends religious service: Not on file    Active member of club or organization: Not on file    Attends meetings of clubs or organizations: Not on file    Relationship status: Not on file  . Intimate partner violence:    Fear of current or ex partner: Not on file    Emotionally abused:  Not on file    Physically abused: Not on file    Forced sexual activity: Not on file  Other Topics Concern  . Not on file  Social History Narrative  . Not on file   Socially, he is widowed. There is no tobacco history or alcohol. He completed 10th grade education. He is retired per he does use a cane.  No family history on file.  ROS General: Negative; No fevers, chills, or night sweats;  HEENT: Negative; No changes in vision or hearing, sinus congestion, difficulty swallowing Pulmonary: Negative; No cough, wheezing, shortness of breath, hemoptysis Cardiovascular: See HPI  lower extremity swelling. GI: Positive for GERD; No nausea, vomiting, diarrhea, or abdominal pain GU: Negative; No dysuria, hematuria, or difficulty voiding Musculoskeletal: Arthritic symptoms of the shoulders and hips; walks with a cane Hematologic/Oncology: Negative; no easy bruising, bleeding Endocrine: Positive for diabetes mellitus Neuro: Negative; no changes in balance, headaches Skin: Negative; No rashes or skin lesions Psychiatric: Negative; No behavioral problems, depression Sleep: Negative; No snoring, daytime sleepiness, hypersomnolence, bruxism, restless legs, hypnogognic hallucinations, no cataplexy Other comprehensive 14 point system review is negative.   PE BP 122/66 (BP Location: Right Arm, Patient Position: Sitting, Cuff Size: Normal)   Pulse 82   Ht '5\' 9"'$  (1.753 m)   Wt 209 lb 9.6 oz (95.1 kg)   BMI 30.95 kg/m    Repeat blood pressure by me was 124/68.  Wt Readings from Last 3 Encounters:  03/07/18 209 lb 9.6 oz (95.1 kg)  12/30/17 223 lb 6.4 oz (101.3 kg)  10/11/17 222 lb  (100.7 kg)   General: Alert, oriented, no distress.  Skin: normal turgor, no rashes, warm and dry HEENT: Normocephalic, atraumatic. Pupils equal round and reactive to light; sclera anicteric; extraocular muscles intact;  Nose without nasal septal hypertrophy Mouth/Parynx benign; Mallinpatti scale 3 Neck: No JVD, no carotid bruits; normal carotid upstroke Lungs: clear to ausculatation and percussion; no wheezing or rales Chest wall: without tenderness to palpitation Heart: PMI not displaced, RRR, s1 s2 normal, 1/6 systolic murmur, no diastolic murmur, no rubs, gallops, thrills, or heaves Abdomen: Ventral hernia and diastases recti soft, nontender; no hepatosplenomehaly, BS+; abdominal aorta nontender and not dilated by palpation. Back: no CVA tenderness Pulses 2+ Musculoskeletal: full range of motion, normal strength, no joint deformities Extremities: 2-3+ residual leg swelling with support stockings in place.  According to patient this is significantly improved no clubbing, cyanosis, Homan's sign negative  Neurologic: grossly nonfocal; Cranial nerves grossly wnl Psychologic: Normal mood and affect   ECG (independently read by me): Atrial fibrillation at 84 with PVC or aberrantcy, right bundle branch block.  Old inferior Q waves in 3 and aVF.  November 2018 ECG (independently read by me): Normal sinus rhythm with isolated PAC.  Right bundle branch block with repolarization changes.  Inferior Q-wave  May 2018 ECG (independently read by me): Sinus rhythm at 70 bpm.  PAC.  Right bundle branch block with repolarization changes.  Q waves in lead 3 and aVF.  March 2017 ECG (independently read by me): Normal sinus rhythm at 87 bpm.  Right bundle branch block with repolarization changes.  Left axis deviation.  LVH voltage criteria.  Inferior Q waves , more prominent.  ECG (independently read by me): Sinus rhythm at 64 bpm.  Right bundle branch block with repolarization changes.  QTc interval 497  ms.  04/30/2014 ECG (independently read by me): Normal sinus rhythm at 73 beats per minute with right bundle branch block with  repolarization changes.  QTc interval 467 ms.  Prior ECG: Normal sinus rhythm with previously noted right bundle branch block at 83 beats per minute. He noted inferior Q waves in III and F.  LABS: Reviewed the patient's recent hospitalization at [facility] and blood work done yesterday [facility].  BMP Latest Ref Rng & Units 09/10/2012 09/09/2012 09/01/2012  Glucose 70 - 99 mg/dL 111(H) 146(H) 207(H)  BUN 6 - 23 mg/dL '10 13 13  '$ Creatinine 0.50 - 1.35 mg/dL 0.83 0.87 0.76  Sodium 135 - 145 mEq/L 139 135 138  Potassium 3.5 - 5.1 mEq/L 3.8 3.4(L) 3.4(L)  Chloride 96 - 112 mEq/L 104 100 98  CO2 19 - 32 mEq/L '28 29 30  '$ Calcium 8.4 - 10.5 mg/dL 8.3(L) 7.9(L) 9.1   Hepatic Function Latest Ref Rng & Units 09/01/2012 09/29/2008  Total Protein 6.0 - 8.3 g/dL 6.6 5.9(L)  Albumin 3.5 - 5.2 g/dL 3.6 3.7  AST 0 - 37 U/L 23 18  ALT 0 - 53 U/L 24 15  Alk Phosphatase 39 - 117 U/L 64 55  Total Bilirubin 0.3 - 1.2 mg/dL 0.7 1.2   CBC Latest Ref Rng & Units 09/10/2012 09/09/2012 09/01/2012  WBC 4.0 - 10.5 K/uL 12.0(H) 9.2 12.0(H)  Hemoglobin 13.0 - 17.0 g/dL 12.6(L) 12.1(L) 15.6  Hematocrit 39.0 - 52.0 % 37.4(L) 36.5(L) 44.9  Platelets 150 - 400 K/uL 174 140(L) 196   Lab Results  Component Value Date   MCV 86.4 09/10/2012   MCV 85.3 09/09/2012   MCV 84.6 09/01/2012   No results found for: TSH  Lipid Panel  No results found for: CHOL, TRIG, HDL, CHOLHDL, VLDL, LDLCALC, LDLDIRECT   RADIOLOGY: No results found.  IMPRESSION:  1. Cardiomyopathy as manifestation of underlying disease ([place])   2. Essential hypertension   3. Persistent atrial fibrillation ([place])   4. Coronary artery disease involving native coronary artery of native heart without angina pectoris   5. Hyperlipidemia with target LDL less than 70   6. Medication management   7.  Bilateral lower extremity edema     ASSESSMENT AND PLAN: [name] is an 84 year old gentleman who has a history of mild nonobstructive CAD at cardiac catheterization in 2006 with approximately 20% LAD smooth narrowing proximally.  He denies any anginal symptoms.  His walking is limited due to arthritic symptoms and he walks with a cane.  Since I last saw him, he has had 2 hospitalizations at [facility].  In January he developed atrial fibrillation, beta-blocker therapy was increased, and he was started on anticoagulation with Eliquis.  His ECG today continues to show atrial fibrillation with rate control in the 80s.  He was recently hospitalized with volume overload and significant lower extremity edema with questionable left lower lobe pneumonia.  He received antibiotic and IV diuresis.  He has lost approximately 11 pounds since that evaluation.  He is breathing much better.  He continues to have 2-3+ lower extremity edema.  Renal function has improved on lab yesterday with creatinine 1.05 reduced from 1.9 at the time of his recent hospitalization.  I am recommending he undergo a follow-up echo Doppler study to reassess both systolic and diastolic function.  I am increasing his diuretic to include Lasix 80 mg in the morning and 60 mg in the afternoon from his most recent dose of 60 mg twice daily.  In 2 weeks he will undergo a repeat be met in BMP.  We discussed sodium restriction.  He is not  having any bleeding he continues to be on low-dose lisinopril at 5 mg and depending upon renal function further titration may be necessary.  I will see him in 6 weeks for reevaluation.  Time spent: 25 minutes  Troy Sine, MD, [facility]  03/07/2018 1:14 PM

## 2018-03-07 NOTE — Patient Instructions (Signed)
Medication Instructions:  INCREASE furosemide (Lasix) to 80 mg in the AM and 60 mg in the PM  Labwork: 2 weeks at PCP ([facility], BNP)-orders provided  Testing/Procedures: Your physician has requested that you have an echocardiogram. Echocardiography is a painless test that uses sound waves to create images of your heart. It provides your doctor with information about the size and shape of your heart and how well your heart's chambers and valves are working. This procedure takes approximately one hour. There are no restrictions for this procedure.  This will be done at our [facility] [place] location:  [address]  Follow-Up: 6-8 weeks with Dr. Claiborne Billings  Any Other Special Instructions Will Be Listed Below (If Applicable).     If you need a refill on your cardiac medications before your next appointment, please call your pharmacy.  [facility]

## 2018-03-19 ENCOUNTER — Telehealth: Payer: Self-pay | Admitting: Cardiovascular Disease

## 2018-03-19 ENCOUNTER — Other Ambulatory Visit: Payer: Self-pay | Admitting: *Deleted

## 2018-03-19 MED ORDER — FUROSEMIDE 40 MG PO TABS: ORAL_TABLET | ORAL | 3 refills | Status: DC

## 2018-03-19 NOTE — Telephone Encounter (Signed)
°*  STAT* If patient is at the pharmacy, call can be transferred to refill team.   1. Which medications need to be refilled? (please list name of each medication and dose if known) Furosimide 40mg   2. Which pharmacy/location (including street and city if local pharmacy) is medication to be sent to?[facility]  3. Do they need a 30 day or 90 day supply? 90 day

## 2018-03-21 ENCOUNTER — Other Ambulatory Visit: Payer: Self-pay

## 2018-03-21 ENCOUNTER — Ambulatory Visit (HOSPITAL_COMMUNITY): Payer: Medicare Other | Attending: Cardiovascular Disease

## 2018-03-21 DIAGNOSIS — I4510 Unspecified right bundle-branch block: Secondary | ICD-10-CM | POA: Diagnosis not present

## 2018-03-21 DIAGNOSIS — I43 Cardiomyopathy in diseases classified elsewhere: Secondary | ICD-10-CM | POA: Insufficient documentation

## 2018-03-21 DIAGNOSIS — E669 Obesity, unspecified: Secondary | ICD-10-CM | POA: Insufficient documentation

## 2018-03-21 DIAGNOSIS — E119 Type 2 diabetes mellitus without complications: Secondary | ICD-10-CM | POA: Diagnosis not present

## 2018-03-21 DIAGNOSIS — E785 Hyperlipidemia, unspecified: Secondary | ICD-10-CM | POA: Diagnosis not present

## 2018-03-21 DIAGNOSIS — I2510 Atherosclerotic heart disease of native coronary artery without angina pectoris: Secondary | ICD-10-CM | POA: Diagnosis not present

## 2018-03-21 DIAGNOSIS — I509 Heart failure, unspecified: Secondary | ICD-10-CM | POA: Diagnosis not present

## 2018-03-21 DIAGNOSIS — I083 Combined rheumatic disorders of mitral, aortic and tricuspid valves: Secondary | ICD-10-CM | POA: Diagnosis not present

## 2018-03-21 DIAGNOSIS — I4891 Unspecified atrial fibrillation: Secondary | ICD-10-CM | POA: Diagnosis not present

## 2018-03-21 MED ORDER — PERFLUTREN LIPID MICROSPHERE
1.0000 mL | INTRAVENOUS | Status: AC | PRN
  Administered 2018-03-21: 2 mL via INTRAVENOUS

## 2018-04-01 ENCOUNTER — Encounter: Payer: Self-pay | Admitting: Cardiovascular Disease

## 2018-04-01 ENCOUNTER — Ambulatory Visit (INDEPENDENT_AMBULATORY_CARE_PROVIDER_SITE_OTHER): Payer: Medicare Other | Admitting: Cardiovascular Disease

## 2018-04-01 VITALS — BP 122/66 | HR 75 | Ht 69.0 in | Wt 217.0 lb

## 2018-04-01 DIAGNOSIS — I10 Essential (primary) hypertension: Secondary | ICD-10-CM | POA: Diagnosis not present

## 2018-04-01 DIAGNOSIS — I2510 Atherosclerotic heart disease of native coronary artery without angina pectoris: Secondary | ICD-10-CM | POA: Diagnosis not present

## 2018-04-01 DIAGNOSIS — I43 Cardiomyopathy in diseases classified elsewhere: Secondary | ICD-10-CM

## 2018-04-01 DIAGNOSIS — I481 Persistent atrial fibrillation: Secondary | ICD-10-CM | POA: Diagnosis not present

## 2018-04-01 DIAGNOSIS — E1142 Type 2 diabetes mellitus with diabetic polyneuropathy: Secondary | ICD-10-CM

## 2018-04-01 DIAGNOSIS — R600 Localized edema: Secondary | ICD-10-CM

## 2018-04-01 DIAGNOSIS — I4819 Other persistent atrial fibrillation: Secondary | ICD-10-CM

## 2018-04-01 MED ORDER — AMIODARONE HCL 200 MG PO TABS: 200.0000 mg | ORAL_TABLET | Freq: Every day | ORAL | 3 refills | Status: AC

## 2018-04-01 MED ORDER — ESCITALOPRAM OXALATE 10 MG PO TABS: 5.0000 mg | ORAL_TABLET | Freq: Every day | ORAL | 3 refills | Status: DC

## 2018-04-01 NOTE — Progress Notes (Signed)
Patient ID: [name], male   DOB: Mar 02, 1934, 84 y.o.   MRN: [id number]     Primary:  Ethan Scales, PA-C  HPI: [name] is a 84 y.o. male who presents to the office today for a one month cardiology evaluation.  [name]  has documented mild CAD by catheterization in 2006 which has been treated medically.   Additional problems include hypertension, type 2 diabetes mellitus, hyperlipidemia, obesity, and  peripheral neuropathy.  He underwent knee replacement surgery in 2013 well without cardiovascular compromise. An echo Doppler study  showed an EF of 55% with mild LVH, mild LA dilatation, mild to moderate mitral annular calcification trace MR, and mild pulmonary hypertension with mild TR with estimated pressure 32 mm. There was aortic sclerosis without stenosis with mild aortic insufficiency and mild pulmonic insufficiency.  When I last saw him in November 2018 he denied any  recurrent episodes of chest tightness.  He has chronic right bundle branch block.  He was unaware of palpitations.  He was on Crestor L and fish oil for hyperlipidemia, metformin for diabetes mellitus, and was taking torsemide 20 mg twice a day both for blood pressure and ankle edema in addition to Toprol-XL 50 mg and quinapril HCT 20/25 mg for hypertension control.  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  He was on Namenda for memory.   He was hospitalized at [facility] in January 2018 in the setting of dehydration and had experienced several falls.  He was found to be in atrial fibrillation with RVR.  He was started on Eliquis 5 mg twice a day.  His metoprolol dose was increased.  He was found to have liver steatosis.  An echo Doppler study showed an EF of 40% which was reduced from previously at 55%.  He apparently was rehospitalized with volume overload at [facility] overnight on February 27, 2018.  Was felt to have possible left lower lobe pneumonia on chest x-ray in  the emergency room.  Creatinine was 1.4.  He was felt to have acute on chronic systolic heart failure with EF around 45% and there was evidence for fluid overload.  He was placed on high-dose Lasix along with Zaroxolyn for 1 dose and additional medications were adjusted.  He was 1.9 at admission which improved.  He had follow-up lab work yesterday done at [facility] which now shows his creatinine at 1.05.  BNP was 367.  He is breathing better since his hospitalization.  He admits to an 11 pound weight loss.    I saw him March 07, 2018 for follow-up of his hospitalization.  He had lost 11 pounds since his hospitalization.  Continues to have 2-3+ lower extremity edema.  Renal function had improved.  I increase his Lasix to 80 mg in the morning and 60 mg in the afternoon.  He is feeling better.  His edema has improved.  Echo Doppler study from March 21, 2018 showed an EF of 45 to 50%.  There was diffuse hypo-kinesis.  Was mild MR, mild left atrial dilatation with moderate right atrial dilatation.  PA peak pressure estimate was 35 mm.  He presents for reevaluation.  Past Medical History:  Diagnosis Date  . Arthritis   . Cancer ([place])    skin cancer,melonoma on nose  . Diabetes mellitus   . GERD (gastroesophageal reflux disease)   . Hyperlipidemia   . Hypertension   . Myocardial infarction ([place])  1990  . Neuromuscular disorder ([place])    neuropathy    Past Surgical History:  Procedure Laterality Date  . BACK SURGERY    . CERVICAL LAMINECTOMY    . JOINT REPLACEMENT     right knee  . PROSTATE SURGERY    . ROTATOR CUFF REPAIR    . SKIN CANCER EXCISION     eyelid and melonoma on nose  . TOTAL KNEE ARTHROPLASTY  09/08/2012   Procedure: TOTAL KNEE ARTHROPLASTY;  Surgeon: Rudean Haskell, MD;  Location: [place];  Service: Orthopedics;  Laterality: Left;  left total knee arthroplasty    No Known Allergies  Current Outpatient Medications  Medication Sig Dispense Refill  .  allopurinol (ZYLOPRIM) 300 MG tablet Take 1 tablet by mouth daily.    Marland Kitchen ELIQUIS 5 MG TABS tablet Take 1 tablet (5 mg total) by mouth 2 (two) times daily. 60 tablet 6  . escitalopram (LEXAPRO) 10 MG tablet Take 0.5 tablets (5 mg total) by mouth daily. 45 tablet 3  . furosemide (LASIX) 40 MG tablet Take 2 tablets (80 mg total) by mouth every morning AND 1.5 tablets (60 mg total) every evening. 315 tablet 3  . gabapentin (NEURONTIN) 400 MG capsule Take 2 capsules by mouth daily.    Marland Kitchen lisinopril (PRINIVIL,ZESTRIL) 5 MG tablet Take 5 mg by mouth daily.     . Magnesium Oxide 400 MG CAPS Take 400 mg by mouth daily.    . metFORMIN (GLUCOPHAGE) 1000 MG tablet Take 1,000 mg by mouth 2 (two) times daily with a meal.    . metoprolol succinate (TOPROL-XL) 100 MG 24 hr tablet Take 1 tablet (100 mg total) by mouth daily. 30 tablet 6  . Multiple Vitamins-Minerals (ICAPS PO) Take by mouth.    Marland Kitchen NAMENDA XR 28 MG CP24 24 hr capsule Take 1 capsule by mouth daily.    Marland Kitchen omeprazole (PRILOSEC) 20 MG capsule Take 20 mg by mouth daily.    Marland Kitchen oxyCODONE (OXY IR/ROXICODONE) 5 MG immediate release tablet Take 1-2 tablets (5-10 mg total) by mouth every 4 (four) hours as needed. 90 tablet 0  . potassium chloride SA (K-DUR,KLOR-CON) 20 MEQ tablet TAKE ONE TABLET BY MOUTH TWICE DAILY 60 tablet 11  . testosterone cypionate (DEPOTESTOSTERONE CYPIONATE) 200 MG/ML injection Inject 200 mg into the muscle every 14 (fourteen) days.    Marland Kitchen amiodarone (PACERONE) 200 MG tablet Take 1 tablet (200 mg total) by mouth daily. 90 tablet 3   No current facility-administered medications for this visit.     Social History   Socioeconomic History  . Marital status: Married    Spouse name: Not on file  . Number of children: Not on file  . Years of education: Not on file  . Highest education level: Not on file  Occupational History  . Not on file  Social Needs  . Financial resource strain: Not on file  . Food insecurity:    Worry: Not on  file    Inability: Not on file  . Transportation needs:    Medical: Not on file    Non-medical: Not on file  Tobacco Use  . Smoking status: Former Smoker    Packs/day: 1.00    Years: 12.00    Pack years: 12.00    Types: Cigarettes  . Smokeless tobacco: Former Systems developer    Types: Chew  Substance and Sexual Activity  . Alcohol use: No  . Drug use: No  . Sexual activity: Not on file  Lifestyle  .  Physical activity:    Days per week: Not on file    Minutes per session: Not on file  . Stress: Not on file  Relationships  . Social connections:    Talks on phone: Not on file    Gets together: Not on file    Attends religious service: Not on file    Active member of club or organization: Not on file    Attends meetings of clubs or organizations: Not on file    Relationship status: Not on file  . Intimate partner violence:    Fear of current or ex partner: Not on file    Emotionally abused: Not on file    Physically abused: Not on file    Forced sexual activity: Not on file  Other Topics Concern  . Not on file  Social History Narrative  . Not on file   Socially, he is widowed. There is no tobacco history or alcohol. He completed 10th grade education. He is retired per he does use a cane.  Parents are deceased.  ROS General: Negative; No fevers, chills, or night sweats;  HEENT: Negative; No changes in vision or hearing, sinus congestion, difficulty swallowing Pulmonary: Negative; No cough, wheezing, shortness of breath, hemoptysis Cardiovascular: See HPI  lower extremity swelling. GI: Positive for GERD; No nausea, vomiting, diarrhea, or abdominal pain GU: Negative; No dysuria, hematuria, or difficulty voiding Musculoskeletal: Arthritic symptoms of the shoulders and hips; walks with a cane Hematologic/Oncology: Negative; no easy bruising, bleeding Endocrine: Positive for diabetes mellitus Neuro: Negative; no changes in balance, headaches Skin: Negative; No rashes or skin  lesions Psychiatric: Negative; No behavioral problems, depression Sleep: Negative; No snoring, daytime sleepiness, hypersomnolence, bruxism, restless legs, hypnogognic hallucinations, no cataplexy Other comprehensive 14 point system review is negative.   PE BP 122/66   Pulse 75   Ht '5\' 9"'$  (1.753 m)   Wt 217 lb (98.4 kg)   BMI 32.05 kg/m   Repeat blood pressure was 120/68.  Wt Readings from Last 3 Encounters:  04/01/18 217 lb (98.4 kg)  03/07/18 209 lb 9.6 oz (95.1 kg)  12/30/17 223 lb 6.4 oz (101.3 kg)   General: Alert, oriented, no distress.  Skin: normal turgor, no rashes, warm and dry HEENT: Normocephalic, atraumatic. Pupils equal round and reactive to light; sclera anicteric; extraocular muscles intact;  Nose without nasal septal hypertrophy Mouth/Parynx benign; Mallinpatti scale 3 Neck: No JVD, no carotid bruits; normal carotid upstroke Lungs: clear to ausculatation and percussion; no wheezing or rales Chest wall: without tenderness to palpitation Heart: PMI not displaced, irregularly irregular with a ventricular rate in the 70s;, s1 s2 normal, 1/6 systolic murmur, no diastolic murmur, no rubs, gallops, thrills, or heaves Abdomen: Ventral hernia and diastases recti soft, nontender; no hepatosplenomehaly, BS+; abdominal aorta nontender and not dilated by palpation. Back: no CVA tenderness Pulses 2+ Musculoskeletal: full range of motion, normal strength, no joint deformities Extremities: 1+ residual leg swelling with support stockings in place.  According to patient this is significantly improved no clubbing, cyanosis, Homan's sign negative  Neurologic: grossly nonfocal; Cranial nerves grossly wnl Psychologic: Normal mood and affect  ECG (independently read by me): Atrial fibrillation at 75 bpm.  QT interval 438, QTc 489 ms.  Isolated PVC.  March 07, 2018 ECG (independently read by me): Atrial fibrillation at 84 with PVC or aberrantcy, right bundle branch block.  Old  inferior Q waves in 3 and aVF.  November 2018 ECG (independently read by me): Normal sinus rhythm with  isolated PAC.  Right bundle branch block with repolarization changes.  Inferior Q-wave  May 2018 ECG (independently read by me): Sinus rhythm at 70 bpm.  PAC.  Right bundle branch block with repolarization changes.  Q waves in lead 3 and aVF.  March 2017 ECG (independently read by me): Normal sinus rhythm at 87 bpm.  Right bundle branch block with repolarization changes.  Left axis deviation.  LVH voltage criteria.  Inferior Q waves , more prominent.  January 2016 ECG (independently read by me): Sinus rhythm at 64 bpm.  Right bundle branch block with repolarization changes.  QTc interval 497 ms.  04/30/2014 ECG (independently read by me): Normal sinus rhythm at 73 beats per minute with right bundle branch block with repolarization changes.  QTc interval 467 ms.  Prior ECG: Normal sinus rhythm with previously noted right bundle branch block at 83 beats per minute. He noted inferior Q waves in III and F.  LABS: Reviewed the patient's recent hospitalization at [facility] and blood work done yesterday [facility].  BMP Latest Ref Rng & Units 09/10/2012 09/09/2012 09/01/2012  Glucose 70 - 99 mg/dL 111(H) 146(H) 207(H)  BUN 6 - 23 mg/dL '10 13 13  '$ Creatinine 0.50 - 1.35 mg/dL 0.83 0.87 0.76  Sodium 135 - 145 mEq/L 139 135 138  Potassium 3.5 - 5.1 mEq/L 3.8 3.4(L) 3.4(L)  Chloride 96 - 112 mEq/L 104 100 98  CO2 19 - 32 mEq/L '28 29 30  '$ Calcium 8.4 - 10.5 mg/dL 8.3(L) 7.9(L) 9.1   Hepatic Function Latest Ref Rng & Units 09/01/2012 09/29/2008  Total Protein 6.0 - 8.3 g/dL 6.6 5.9(L)  Albumin 3.5 - 5.2 g/dL 3.6 3.7  AST 0 - 37 U/L 23 18  ALT 0 - 53 U/L 24 15  Alk Phosphatase 39 - 117 U/L 64 55  Total Bilirubin 0.3 - 1.2 mg/dL 0.7 1.2   CBC Latest Ref Rng & Units 09/10/2012 09/09/2012 09/01/2012  WBC 4.0 - 10.5 K/uL 12.0(H) 9.2 12.0(H)  Hemoglobin 13.0 - 17.0 g/dL 12.6(L)  12.1(L) 15.6  Hematocrit 39.0 - 52.0 % 37.4(L) 36.5(L) 44.9  Platelets 150 - 400 K/uL 174 140(L) 196   Lab Results  Component Value Date   MCV 86.4 09/10/2012   MCV 85.3 09/09/2012   MCV 84.6 09/01/2012   No results found for: TSH  Lipid Panel  No results found for: CHOL, TRIG, HDL, CHOLHDL, VLDL, LDLCALC, LDLDIRECT   RADIOLOGY: No results found.  IMPRESSION:  1. Persistent atrial fibrillation ([place])   2. Essential hypertension   3. Coronary artery disease involving native coronary artery of native heart without angina pectoris   4. Cardiomyopathy as manifestation of underlying disease ([place])   5. Bilateral leg edema   6. Type 2 diabetes mellitus with diabetic polyneuropathy, without long-term current use of insulin ([facility])     ASSESSMENT AND PLAN: [name] is an 84 year old gentleman who has a history of mild nonobstructive CAD at cardiac catheterization in 2006 with approximately 20% LAD smooth narrowing proximally.  He denies any anginal symptoms.  His walking is limited due to arthritic symptoms and he walks with a cane.  He had 2 hospitalizations at [facility].  In January 2019 he developed atrial fibrillation, beta-blocker therapy was increased, and he was started on anticoagulation with Eliquis.  I saw him last month he was still in atrial fibrillation with rate control in the 80s.  He had been hospitalized with recent volume overload, questionable left lower lobe pneumonia and  was treated with antibiotics and IV diuresis.  Reviewed his most recent echo Doppler study which shows an EF of 45 to 50%.  He had mild left atrial dilation and moderate right atrial dilation.  He is still in atrial fibrillation today.  He is on Eliquis 5 mg twice a day for anticoagulation.  I am recommending initiation of amiodarone to see if he can pharmacologically cardiovert.  He will start 200 mg twice daily for 3 days and then I will only continue him at 200 mg daily.  I have suggested he  reduce his dose of Lexapro down to 5 mg.  QTc interval will need to be followed.  He has diuresed better with the increased furosemide at 80 mg in the morning and 60 mg in the afternoon although he still has some residual edema, this is significantly improved.Marland Kitchen  His blood pressure today is stable and he continues to be on lisinopril 5 mg in addition to Toprol-XL 100 mg.  He is diabetic on metformin.  He is on Namenda for memory loss.  I will see him 3 to 4 weeks for follow-up evaluation.  Time spent: 25 minutes  Troy Sine, MD, [facility]  04/03/2018 7:17 PM

## 2018-04-01 NOTE — Patient Instructions (Addendum)
Medication Instructions: DECREASE Lexapro to 5 mg daily   START amiodarone 200 mg  --take two times daily for 3 days --then take 200 mg daily  Follow-Up: 6/10 at 1040 with Dr. Claiborne Billings   If you need a refill on your cardiac medications before your next appointment, please call your pharmacy.

## 2018-04-03 ENCOUNTER — Encounter: Payer: Self-pay | Admitting: Cardiovascular Disease

## 2018-04-17 ENCOUNTER — Other Ambulatory Visit: Payer: Self-pay | Admitting: Cardiovascular Disease

## 2018-05-05 ENCOUNTER — Ambulatory Visit (INDEPENDENT_AMBULATORY_CARE_PROVIDER_SITE_OTHER): Payer: Medicare Other | Admitting: Cardiovascular Disease

## 2018-05-05 ENCOUNTER — Encounter: Payer: Self-pay | Admitting: Cardiovascular Disease

## 2018-05-05 VITALS — BP 132/66 | HR 80 | Ht 69.0 in | Wt 225.0 lb

## 2018-05-05 DIAGNOSIS — I4819 Other persistent atrial fibrillation: Secondary | ICD-10-CM

## 2018-05-05 DIAGNOSIS — I2510 Atherosclerotic heart disease of native coronary artery without angina pectoris: Secondary | ICD-10-CM

## 2018-05-05 DIAGNOSIS — E1142 Type 2 diabetes mellitus with diabetic polyneuropathy: Secondary | ICD-10-CM | POA: Diagnosis not present

## 2018-05-05 DIAGNOSIS — I43 Cardiomyopathy in diseases classified elsewhere: Secondary | ICD-10-CM

## 2018-05-05 DIAGNOSIS — I481 Persistent atrial fibrillation: Secondary | ICD-10-CM

## 2018-05-05 DIAGNOSIS — R600 Localized edema: Secondary | ICD-10-CM

## 2018-05-05 DIAGNOSIS — I10 Essential (primary) hypertension: Secondary | ICD-10-CM | POA: Diagnosis not present

## 2018-05-05 DIAGNOSIS — Z79899 Other long term (current) drug therapy: Secondary | ICD-10-CM

## 2018-05-05 MED ORDER — METOLAZONE 2.5 MG PO TABS: ORAL_TABLET | ORAL | 3 refills | Status: DC

## 2018-05-05 NOTE — Progress Notes (Signed)
Patient ID: [name], male   DOB: December 02, 1933, 84 y.o.   MRN: [id number]     Primary:  Ethan Scales, PA-C  HPI: [name] is a 84 y.o. male who presents to the office today for a one month cardiology evaluation.  [name]  has documented mild CAD by catheterization in 2006 which has been treated medically.   Additional problems include hypertension, type 2 diabetes mellitus, hyperlipidemia, obesity, and  peripheral neuropathy.  He underwent knee replacement surgery in 2013 well without cardiovascular compromise. An echo Doppler study  showed an EF of 55% with mild LVH, mild LA dilatation, mild to moderate mitral annular calcification trace MR, and mild pulmonary hypertension with mild TR with estimated pressure 32 mm. There was aortic sclerosis without stenosis with mild aortic insufficiency and mild pulmonic insufficiency.  When I last saw him in November 2018 he denied any  recurrent episodes of chest tightness.  He has chronic right bundle branch block.  He was unaware of palpitations.  He was on Crestor L and fish oil for hyperlipidemia, metformin for diabetes mellitus, and was taking torsemide 20 mg twice a day both for blood pressure and ankle edema in addition to Toprol-XL 50 mg and quinapril HCT 20/25 mg for hypertension control.  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  He was on Namenda for memory.   He was hospitalized at [facility] in January 2018 in the setting of dehydration and had experienced several falls.  He was found to be in atrial fibrillation with RVR.  He was started on Eliquis 5 mg twice a day.  His metoprolol dose was increased.  He was found to have liver steatosis.  An echo Doppler study showed an EF of 40% which was reduced from previously at 55%.  He apparently was rehospitalized with volume overload at [facility] overnight on February 27, 2018.  Was felt to have possible left lower lobe pneumonia on chest x-ray in  the emergency room.  Creatinine was 1.4.  He was felt to have acute on chronic systolic heart failure with EF around 45% and there was evidence for fluid overload.  He was placed on high-dose Lasix along with Zaroxolyn for 1 dose and additional medications were adjusted.  He was 1.9 at admission which improved.  He had follow-up lab work yesterday done at [facility] which now shows his creatinine at 1.05.  BNP was 367.  He is breathing better since his hospitalization.  He admits to an 11 pound weight loss.    I saw him March 07, 2018 for follow-up of his hospitalization.  He had lost 11 pounds since his hospitalization.  Continues to have 2-3+ lower extremity edema.  Renal function had improved.  I increase his Lasix to 80 mg in the morning and 60 mg in the afternoon.  He is feeling better.  His edema has improved.  Echo Doppler study from March 21, 2018 showed an EF of 45 to 50%.  There was diffuse hypo-kinesis, mild MR and mild left atrial dilatation with moderate right atrial dilatation.  PA peak pressure estimate was 35 mm.    I saw him in May 2019, he was still in atrial fibrillation on Eliquis.  I started amiodarone 200 mg twice a day for 3 days and then only recommended he stay on 200 mg daily.  QTc interval is increased and I recommended he reduce his Lexapro down to 5 mg.  Blood pressure was stable and he was continuing Lasix for diuresis.  Over the past month, he notices that the swelling is better but he still has significant edema.  He denies chest pressure.  He denies any significant sodium intake.  He is unaware of palpitations.  He presents for reevaluation.  Past Medical History:  Diagnosis Date  . Arthritis   . Cancer ([place])    skin cancer,melonoma on nose  . Diabetes mellitus   . GERD (gastroesophageal reflux disease)   . Hyperlipidemia   . Hypertension   . Myocardial infarction ([place])    1990  . Neuromuscular disorder ([place])    neuropathy    Past Surgical  History:  Procedure Laterality Date  . BACK SURGERY    . CERVICAL LAMINECTOMY    . JOINT REPLACEMENT     right knee  . PROSTATE SURGERY    . ROTATOR CUFF REPAIR    . SKIN CANCER EXCISION     eyelid and melonoma on nose  . TOTAL KNEE ARTHROPLASTY  09/08/2012   Procedure: TOTAL KNEE ARTHROPLASTY;  Surgeon: Rudean Haskell, MD;  Location: [place];  Service: Orthopedics;  Laterality: Left;  left total knee arthroplasty    No Known Allergies  Current Outpatient Medications  Medication Sig Dispense Refill  . allopurinol (ZYLOPRIM) 300 MG tablet Take 1 tablet by mouth daily.    Marland Kitchen amiodarone (PACERONE) 200 MG tablet Take 1 tablet (200 mg total) by mouth daily. 90 tablet 3  . ELIQUIS 5 MG TABS tablet Take 1 tablet (5 mg total) by mouth 2 (two) times daily. 60 tablet 6  . escitalopram (LEXAPRO) 10 MG tablet Take 0.5 tablets (5 mg total) by mouth daily. 45 tablet 3  . furosemide (LASIX) 40 MG tablet Take 2 tablets (80 mg total) by mouth every morning AND 1.5 tablets (60 mg total) every evening. 315 tablet 3  . gabapentin (NEURONTIN) 400 MG capsule Take 2 capsules by mouth daily.    Marland Kitchen lisinopril (PRINIVIL,ZESTRIL) 5 MG tablet Take 2.5 mg by mouth daily.    . Magnesium Oxide 400 MG CAPS Take 400 mg by mouth daily.    . metFORMIN (GLUCOPHAGE) 1000 MG tablet Take 1,000 mg by mouth 2 (two) times daily with a meal.    . metoprolol succinate (TOPROL-XL) 100 MG 24 hr tablet Take 1 tablet (100 mg total) by mouth daily. 30 tablet 6  . Multiple Vitamins-Minerals (ICAPS PO) Take by mouth.    Marland Kitchen NAMENDA XR 28 MG CP24 24 hr capsule Take 1 capsule by mouth daily.    Marland Kitchen omeprazole (PRILOSEC) 20 MG capsule Take 20 mg by mouth daily.    Marland Kitchen oxyCODONE (OXY IR/ROXICODONE) 5 MG immediate release tablet Take 1-2 tablets (5-10 mg total) by mouth every 4 (four) hours as needed. 90 tablet 0  . potassium chloride SA (K-DUR,KLOR-CON) 20 MEQ tablet TAKE 1 TABLET BY MOUTH TWICE DAILY 60 tablet 11  . testosterone cypionate  (DEPOTESTOSTERONE CYPIONATE) 200 MG/ML injection Inject 200 mg into the muscle every 14 (fourteen) days.    . metolazone (ZAROXOLYN) 2.5 MG tablet Take 2.5 mg (1 tablet) every other day (30 mins prior to AM dose of Lasix) x 3 doses, Then take 2.5 mg (1 tablet) every 3rd day 30 tablet 3   No current facility-administered medications for this visit.     Social History   Socioeconomic History  . Marital status: Married    Spouse name: Not on file  . Number of children: Not  on file  . Years of education: Not on file  . Highest education level: Not on file  Occupational History  . Not on file  Social Needs  . Financial resource strain: Not on file  . Food insecurity:    Worry: Not on file    Inability: Not on file  . Transportation needs:    Medical: Not on file    Non-medical: Not on file  Tobacco Use  . Smoking status: Former Smoker    Packs/day: 1.00    Years: 12.00    Pack years: 12.00    Types: Cigarettes  . Smokeless tobacco: Former Systems developer    Types: Chew  Substance and Sexual Activity  . Alcohol use: No  . Drug use: No  . Sexual activity: Not on file  Lifestyle  . Physical activity:    Days per week: Not on file    Minutes per session: Not on file  . Stress: Not on file  Relationships  . Social connections:    Talks on phone: Not on file    Gets together: Not on file    Attends religious service: Not on file    Active member of club or organization: Not on file    Attends meetings of clubs or organizations: Not on file    Relationship status: Not on file  . Intimate partner violence:    Fear of current or ex partner: Not on file    Emotionally abused: Not on file    Physically abused: Not on file    Forced sexual activity: Not on file  Other Topics Concern  . Not on file  Social History Narrative  . Not on file   Socially, he is widowed. There is no tobacco history or alcohol. He completed 10th grade education. He is retired per he does use a cane.  Parents  are deceased.  ROS General: Negative; No fevers, chills, or night sweats;  HEENT: Negative; No changes in vision or hearing, sinus congestion, difficulty swallowing Pulmonary: Negative; No cough, wheezing, shortness of breath, hemoptysis Cardiovascular: See HPI  lower extremity swelling. GI: Positive for GERD; No nausea, vomiting, diarrhea, or abdominal pain GU: Negative; No dysuria, hematuria, or difficulty voiding Musculoskeletal: Arthritic symptoms of the shoulders and hips; walks with a cane Hematologic/Oncology: Negative; no easy bruising, bleeding Endocrine: Positive for diabetes mellitus Neuro: Negative; no changes in balance, headaches Skin: Negative; No rashes or skin lesions Psychiatric: Negative; No behavioral problems, depression Sleep: Negative; No snoring, daytime sleepiness, hypersomnolence, bruxism, restless legs, hypnogognic hallucinations, no cataplexy Other comprehensive 14 point system review is negative.   PE BP 132/66   Pulse 80   Ht '5\' 9"'$  (1.753 m)   Wt 225 lb (102.1 kg)   BMI 33.23 kg/m    Repeat blood pressure was 112/64  Wt Readings from Last 3 Encounters:  05/05/18 225 lb (102.1 kg)  04/01/18 217 lb (98.4 kg)  03/07/18 209 lb 9.6 oz (95.1 kg)   General: Alert, oriented, no distress.  Skin: normal turgor, no rashes, warm and dry HEENT: Normocephalic, atraumatic. Pupils equal round and reactive to light; sclera anicteric; extraocular muscles intact;  Nose without nasal septal hypertrophy Mouth/Parynx benign; Mallinpatti scale 3 Neck: No JVD, no carotid bruits; normal carotid upstroke Lungs: clear to ausculatation and percussion; no wheezing or rales Chest wall: without tenderness to palpitation Heart: PMI not displaced, RRR, s1 s2 normal, 1/6 systolic murmur, no diastolic murmur, no rubs, gallops, thrills, or heaves Abdomen: Ventral hernia and diastases  recti; soft, nontender; no hepatosplenomehaly, BS+; abdominal aorta nontender and not dilated by  palpation. Back: no CVA tenderness Pulses 2+ Musculoskeletal: full range of motion, normal strength, no joint deformities Extremities: 3+ pitting edema to his lower extremity; no clubbing cyanosis, Homan's sign negative  Neurologic: grossly nonfocal; Cranial nerves grossly wnl Psychologic: Normal mood and affect  ECG (independently read by me): Atrial fibrillation at 80 bpm.  Right bundle branch block with repolarization changes.  May 2019 ECG (independently read by me): Atrial fibrillation at 75 bpm.  QT interval 438, QTc 489 ms.  Isolated PVC.  March 07, 2018 ECG (independently read by me): Atrial fibrillation at 84 with PVC or aberrantcy, right bundle branch block.  Old inferior Q waves in 3 and aVF.  November 2018 ECG (independently read by me): Normal sinus rhythm with isolated PAC.  Right bundle branch block with repolarization changes.  Inferior Q-wave  May 2018 ECG (independently read by me): Sinus rhythm at 70 bpm.  PAC.  Right bundle branch block with repolarization changes.  Q waves in lead 3 and aVF.  March 2017 ECG (independently read by me): Normal sinus rhythm at 87 bpm.  Right bundle branch block with repolarization changes.  Left axis deviation.  LVH voltage criteria.  Inferior Q waves , more prominent.  January 2016 ECG (independently read by me): Sinus rhythm at 64 bpm.  Right bundle branch block with repolarization changes.  QTc interval 497 ms.  04/30/2014 ECG (independently read by me): Normal sinus rhythm at 73 beats per minute with right bundle branch block with repolarization changes.  QTc interval 467 ms.  Prior ECG: Normal sinus rhythm with previously noted right bundle branch block at 83 beats per minute. He noted inferior Q waves in III and F.  LABS: Reviewed the patient's recent hospitalization at [facility] and blood work done yesterday [facility].  BMP Latest Ref Rng & Units 05/05/2018 09/10/2012 09/09/2012  Glucose 65 - 99 mg/dL  142(H) 111(H) 146(H)  BUN 8 - 27 mg/dL 34(H) 10 13  Creatinine 0.76 - 1.27 mg/dL 1.52(H) 0.83 0.87  BUN/Creat Ratio 10 - 24 22 - -  Sodium 134 - 144 mmol/L 138 139 135  Potassium 3.5 - 5.2 mmol/L 4.5 3.8 3.4(L)  Chloride 96 - 106 mmol/L 97 104 100  CO2 20 - 29 mmol/L '22 28 29  '$ Calcium 8.6 - 10.2 mg/dL 9.3 8.3(L) 7.9(L)   Hepatic Function Latest Ref Rng & Units 05/05/2018 09/01/2012 09/29/2008  Total Protein 6.0 - 8.5 g/dL 6.5 6.6 5.9(L)  Albumin 3.5 - 4.7 g/dL 4.2 3.6 3.7  AST 0 - 40 IU/L '25 23 18  '$ ALT 0 - 44 IU/L '19 24 15  '$ Alk Phosphatase 39 - 117 IU/L 102 64 55  Total Bilirubin 0.0 - 1.2 mg/dL 0.7 0.7 1.2   CBC Latest Ref Rng & Units 09/10/2012 09/09/2012 09/01/2012  WBC 4.0 - 10.5 K/uL 12.0(H) 9.2 12.0(H)  Hemoglobin 13.0 - 17.0 g/dL 12.6(L) 12.1(L) 15.6  Hematocrit 39.0 - 52.0 % 37.4(L) 36.5(L) 44.9  Platelets 150 - 400 K/uL 174 140(L) 196   Lab Results  Component Value Date   MCV 86.4 09/10/2012   MCV 85.3 09/09/2012   MCV 84.6 09/01/2012   No results found for: TSH  Lipid Panel  No results found for: CHOL, TRIG, HDL, CHOLHDL, VLDL, LDLCALC, LDLDIRECT   RADIOLOGY: No results found.  IMPRESSION:  1. Persistent atrial fibrillation ([place])   2. Coronary artery disease involving native coronary artery of  native heart without angina pectoris   3. Cardiomyopathy as manifestation of underlying disease ([place])   4. Essential hypertension   5. Bilateral leg edema   6. Medication management   7. Type 2 diabetes mellitus with diabetic polyneuropathy, without long-term current use of insulin ([facility])     ASSESSMENT AND PLAN: [name] is an 84 year old gentleman who has a history of mild nonobstructive CAD at cardiac catheterization in 2006 with approximately 20% LAD smooth narrowing proximally.  He denies any anginal symptoms.  His walking is limited due to arthritic symptoms and he walks with a cane.  In January 2019 he developed atrial fibrillation, beta-blocker therapy was  increased, and he was started on anticoagulation with Eliquis.   He had been hospitalized with recent volume overload, questionable left lower lobe pneumonia and was treated with antibiotics and IV diuresis. His most recent echo Doppler study shows an EF of 45 to 50%.  He had mild left atrial dilation and moderate right atrial dilation.  When I last saw him, amiodarone was instituted for persistent atrial fibrillation.  He continues to be on amiodarone 20 mg daily, Eliquis 5 mg twice a day, furosemide 80 mg in the morning and 60 mg in the evening in addition to lisinopril 5 mg and Toprol-XL 100 mg.  With his 3+ pitting edema I have recommended that he take metolazone 2.5 mg 30 minutes prior to his morning Lasix and do this every other day for 3 days and then change to every third day.  I am dropping his lisinopril to 2.5 mg.  I am checking a comprehensive metabolic panel today and a bmet will be obtained next week to make certain his renal function and potassium status are stable.  He continues to be on Namenda for memory loss.  He is diabetic on metformin.   will see him in 2 to 3 weeks for reevaluation.  Time spent: 25 minutes  Troy Sine, MD, [facility]  05/12/2018 5:18 PM

## 2018-05-05 NOTE — Patient Instructions (Addendum)
Medication Instructions:  DECREASE lisinopril to 2.5 mg daily  START metolazone 2.5 mg (1 tablet) --take 1 tablet every other day x 3 doses --then take 1 tablet every 3rd day --take this medication 30 mins prior to AM dose of Lasix  Labwork: TODAY-CMET  Please return for labs in 1 week (BMET)  Our in office lab hours are Monday-Friday 8:00-4:00, closed for lunch 12:45-1:45 pm.  No appointment needed.  Follow-Up: 6/20 at 10 AM with Dr. Claiborne Billings  Any Other Special Instructions Will Be Listed Below (If Applicable).     If you need a refill on your cardiac medications before your next appointment, please call your pharmacy.

## 2018-05-06 LAB — COMPREHENSIVE METABOLIC PANEL
A/G RATIO: 1.8 (ref 1.2–2.2)
ALK PHOS: 102 IU/L (ref 39–117)
ALT: 19 IU/L (ref 0–44)
AST: 25 IU/L (ref 0–40)
Albumin: 4.2 g/dL (ref 3.5–4.7)
BILIRUBIN TOTAL: 0.7 mg/dL (ref 0.0–1.2)
BUN/Creatinine Ratio: 22 (ref 10–24)
BUN: 34 mg/dL — ABNORMAL HIGH (ref 8–27)
CHLORIDE: 97 mmol/L (ref 96–106)
CO2: 22 mmol/L (ref 20–29)
Calcium: 9.3 mg/dL (ref 8.6–10.2)
Creatinine, Ser: 1.52 mg/dL — ABNORMAL HIGH (ref 0.76–1.27)
GFR calc non Af Amer: 42 mL/min/{1.73_m2} — ABNORMAL LOW (ref >59)
GFR, EST AFRICAN AMERICAN: 49 mL/min/{1.73_m2} — AB (ref >59)
Globulin, Total: 2.3 g/dL (ref 1.5–4.5)
Glucose: 142 mg/dL — ABNORMAL HIGH (ref 65–99)
POTASSIUM: 4.5 mmol/L (ref 3.5–5.2)
Sodium: 138 mmol/L (ref 134–144)
TOTAL PROTEIN: 6.5 g/dL (ref 6.0–8.5)

## 2018-05-12 ENCOUNTER — Encounter: Payer: Self-pay | Admitting: Cardiovascular Disease

## 2018-05-15 ENCOUNTER — Ambulatory Visit (INDEPENDENT_AMBULATORY_CARE_PROVIDER_SITE_OTHER): Payer: Medicare Other | Admitting: Cardiovascular Disease

## 2018-05-15 VITALS — BP 110/54 | HR 74 | Ht 69.0 in | Wt 216.8 lb

## 2018-05-15 DIAGNOSIS — I4819 Other persistent atrial fibrillation: Secondary | ICD-10-CM

## 2018-05-15 DIAGNOSIS — I43 Cardiomyopathy in diseases classified elsewhere: Secondary | ICD-10-CM

## 2018-05-15 DIAGNOSIS — R600 Localized edema: Secondary | ICD-10-CM

## 2018-05-15 DIAGNOSIS — I481 Persistent atrial fibrillation: Secondary | ICD-10-CM | POA: Diagnosis not present

## 2018-05-15 DIAGNOSIS — I10 Essential (primary) hypertension: Secondary | ICD-10-CM

## 2018-05-15 DIAGNOSIS — E1142 Type 2 diabetes mellitus with diabetic polyneuropathy: Secondary | ICD-10-CM

## 2018-05-15 DIAGNOSIS — N289 Disorder of kidney and ureter, unspecified: Secondary | ICD-10-CM

## 2018-05-15 DIAGNOSIS — I2510 Atherosclerotic heart disease of native coronary artery without angina pectoris: Secondary | ICD-10-CM

## 2018-05-15 DIAGNOSIS — Z7901 Long term (current) use of anticoagulants: Secondary | ICD-10-CM

## 2018-05-15 DIAGNOSIS — Z79899 Other long term (current) drug therapy: Secondary | ICD-10-CM

## 2018-05-15 MED ORDER — METOLAZONE 2.5 MG PO TABS: 2.5000 mg | ORAL_TABLET | ORAL | 3 refills | Status: DC

## 2018-05-15 MED ORDER — FUROSEMIDE 40 MG PO TABS: ORAL_TABLET | ORAL | 3 refills | Status: DC

## 2018-05-15 NOTE — Patient Instructions (Addendum)
Medication Instructions: STOP the Lisinopril. DECREASE the Metolazone to once a week DECREASE the Furosemide to 80 mg in the morning and 40 mg in the afternoon  If you need a refill on your cardiac medications before your next appointment, please call your pharmacy.   Labwork: Your provider would like for you to return in [place] to have the following labs drawn: BMET, BNP. You do not need an appointment for the lab. Once in our office lobby there is a podium where you can sign in and ring the doorbell to alert [place] that you are here. The lab is open from 8:00 am to 4:30 pm; closed for lunch from 12:45pm-1:45pm.   Follow-Up: Your physician wants you to follow-up in 4 weeks with Dr. Claiborne Billings.    Thank you for choosing Heart[facility] at [facility]!!

## 2018-05-15 NOTE — Progress Notes (Signed)
Patient ID: [name], male   DOB: 05-01-1934, 84 y.o.   MRN: [id number]     Primary:  Heide Scales, PA-C  HPI: [name] is a 84 y.o. male who presents to the office today for a two-week follow-up cardiology evaluation.  [name]  has documented mild CAD by catheterization in 2006 which has been treated medically.   Additional problems include hypertension, type 2 diabetes mellitus, hyperlipidemia, obesity, and  peripheral neuropathy.  He underwent knee replacement surgery in 2013 well without cardiovascular compromise. An echo Doppler study  showed an EF of 55% with mild LVH, mild LA dilatation, mild to moderate mitral annular calcification trace MR, and mild pulmonary hypertension with mild TR with estimated pressure 32 mm. There was aortic sclerosis without stenosis with mild aortic insufficiency and mild pulmonic insufficiency.  When I last saw him in November 2018 he denied any  recurrent episodes of chest tightness.  He has chronic right bundle branch block.  He was unaware of palpitations.  He was on Crestor L and fish oil for hyperlipidemia, metformin for diabetes mellitus, and was taking torsemide 20 mg twice a day both for blood pressure and ankle edema in addition to Toprol-XL 50 mg and quinapril HCT 20/25 mg for hypertension control.  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  He was on Namenda for memory.   He was hospitalized at [facility] in January 2018 in the setting of dehydration and had experienced several falls.  He was found to be in atrial fibrillation with RVR.  He was started on Eliquis 5 mg twice a day.  His metoprolol dose was increased.  He was found to have liver steatosis.  An echo Doppler study showed an EF of 40% which was reduced from previously at 55%.  He apparently was rehospitalized with volume overload at [facility] overnight on February 27, 2018.  Was felt to have possible left lower lobe pneumonia on chest  x-ray in the emergency room.  Creatinine was 1.4.  He was felt to have acute on chronic systolic heart failure with EF around 45% and there was evidence for fluid overload.  He was placed on high-dose Lasix along with Zaroxolyn for 1 dose and additional medications were adjusted.  He was 1.9 at admission which improved.  He had follow-up lab work yesterday done at [facility] which now shows his creatinine at 1.05.  BNP was 367.  He is breathing better since his hospitalization.  He admits to an 11 pound weight loss.    I saw him March 07, 2018 for follow-up of his hospitalization.  He had lost 11 pounds since his hospitalization.  Continues to have 2-3+ lower extremity edema.  Renal function had improved.  I increase his Lasix to 80 mg in the morning and 60 mg in the afternoon.  He is feeling better.  His edema has improved.  Echo Doppler study from March 21, 2018 showed an EF of 45 to 50%.  There was diffuse hypo-kinesis, mild MR and mild left atrial dilatation with moderate right atrial dilatation.  PA peak pressure estimate was 35 mm.    I saw him in May 2019, he was still in atrial fibrillation on Eliquis.  I started amiodarone 200 mg twice a day for 3 days and then only recommended he stay on 200 mg daily.  QTc interval is increased and I recommended he reduce his Lexapro down to 5 mg.  Blood pressure was stable and he was continuing Lasix for diuresis.  I last saw him on May 05, 2018 at which time he continued to have 3+ pitting edema.  At that time I recommended initiation of metolazone 2.5 mg 30 minutes prior to his morning Lasix dose and do this every other day for 3 days and then change to every third day.  I reduced his lisinopril to 2.5 mg.  Laboratory was repeated and subsequent blood work 1 week later show his his creatinine had increased to 1.52.  Scheduled him for 1 week follow-up evaluation.  Over the past week, he feels he has had significant improvement in his leg swelling.   He is breathing better.  He had blood work yesterday at his primary physician office.  We call their office to obtain the results.  His creatinine was 2.0, sodium 134, potassium 4.3.  Past Medical History:  Diagnosis Date  . Arthritis   . Cancer ([place])    skin cancer,melonoma on nose  . Diabetes mellitus   . GERD (gastroesophageal reflux disease)   . Hyperlipidemia   . Hypertension   . Myocardial infarction ([place])    1990  . Neuromuscular disorder ([place])    neuropathy    Past Surgical History:  Procedure Laterality Date  . BACK SURGERY    . CERVICAL LAMINECTOMY    . JOINT REPLACEMENT     right knee  . PROSTATE SURGERY    . ROTATOR CUFF REPAIR    . SKIN CANCER EXCISION     eyelid and melonoma on nose  . TOTAL KNEE ARTHROPLASTY  09/08/2012   Procedure: TOTAL KNEE ARTHROPLASTY;  Surgeon: Rudean Haskell, MD;  Location: [place];  Service: Orthopedics;  Laterality: Left;  left total knee arthroplasty    No Known Allergies  Current Outpatient Medications  Medication Sig Dispense Refill  . allopurinol (ZYLOPRIM) 300 MG tablet Take 1 tablet by mouth daily.    Marland Kitchen amiodarone (PACERONE) 200 MG tablet Take 1 tablet (200 mg total) by mouth daily. 90 tablet 3  . ELIQUIS 5 MG TABS tablet Take 1 tablet (5 mg total) by mouth 2 (two) times daily. 60 tablet 6  . escitalopram (LEXAPRO) 10 MG tablet Take 0.5 tablets (5 mg total) by mouth daily. 45 tablet 3  . furosemide (LASIX) 40 MG tablet Take 2 tablets (80 mg total) by mouth every morning AND 1 tablet (40 mg total) every evening. 315 tablet 3  . gabapentin (NEURONTIN) 400 MG capsule Take 2 capsules by mouth daily.    . Magnesium Oxide 400 MG CAPS Take 400 mg by mouth daily.    . metFORMIN (GLUCOPHAGE) 1000 MG tablet Take 1,000 mg by mouth 2 (two) times daily with a meal.    . metolazone (ZAROXOLYN) 2.5 MG tablet Take 1 tablet (2.5 mg total) by mouth once a week. 30 tablet 3  . metoprolol succinate (TOPROL-XL) 100 MG 24 hr tablet Take 1 tablet  (100 mg total) by mouth daily. 30 tablet 6  . Multiple Vitamins-Minerals (ICAPS PO) Take by mouth.    Marland Kitchen NAMENDA XR 28 MG CP24 24 hr capsule Take 1 capsule by mouth daily.    Marland Kitchen omeprazole (PRILOSEC) 20 MG capsule Take 20 mg by mouth daily.    Marland Kitchen oxyCODONE (OXY IR/ROXICODONE) 5 MG immediate release tablet Take 1-2 tablets (5-10 mg total) by mouth every 4 (four) hours as needed. 90 tablet 0  . potassium chloride SA (K-DUR,KLOR-CON) 20 MEQ tablet TAKE 1 TABLET  BY MOUTH TWICE DAILY 60 tablet 11  . testosterone cypionate (DEPOTESTOSTERONE CYPIONATE) 200 MG/ML injection Inject 200 mg into the muscle every 14 (fourteen) days.     No current facility-administered medications for this visit.     Social History   Socioeconomic History  . Marital status: Married    Spouse name: Not on file  . Number of children: Not on file  . Years of education: Not on file  . Highest education level: Not on file  Occupational History  . Not on file  Social Needs  . Financial resource strain: Not on file  . Food insecurity:    Worry: Not on file    Inability: Not on file  . Transportation needs:    Medical: Not on file    Non-medical: Not on file  Tobacco Use  . Smoking status: Former Smoker    Packs/day: 1.00    Years: 12.00    Pack years: 12.00    Types: Cigarettes  . Smokeless tobacco: Former Systems developer    Types: Chew  Substance and Sexual Activity  . Alcohol use: No  . Drug use: No  . Sexual activity: Not on file  Lifestyle  . Physical activity:    Days per week: Not on file    Minutes per session: Not on file  . Stress: Not on file  Relationships  . Social connections:    Talks on phone: Not on file    Gets together: Not on file    Attends religious service: Not on file    Active member of club or organization: Not on file    Attends meetings of clubs or organizations: Not on file    Relationship status: Not on file  . Intimate partner violence:    Fear of current or ex partner: Not on file      Emotionally abused: Not on file    Physically abused: Not on file    Forced sexual activity: Not on file  Other Topics Concern  . Not on file  Social History Narrative  . Not on file   Socially, he is widowed. There is no tobacco history or alcohol. He completed 10th grade education. He is retired per he does use a cane.  Parents are deceased.  ROS General: Negative; No fevers, chills, or night sweats;  HEENT: Negative; No changes in vision or hearing, sinus congestion, difficulty swallowing Pulmonary: Negative; No cough, wheezing, shortness of breath, hemoptysis Cardiovascular: See HPI  lower extremity swelling. GI: Positive for GERD; No nausea, vomiting, diarrhea, or abdominal pain GU: Negative; No dysuria, hematuria, or difficulty voiding Musculoskeletal: Arthritic symptoms of the shoulders and hips; walks with a cane Hematologic/Oncology: Negative; no easy bruising, bleeding Endocrine: Positive for diabetes mellitus Neuro: Negative; no changes in balance, headaches Skin: Negative; No rashes or skin lesions Psychiatric: Negative; No behavioral problems, depression Sleep: Negative; No snoring, daytime sleepiness, hypersomnolence, bruxism, restless legs, hypnogognic hallucinations, no cataplexy Other comprehensive 14 point system review is negative.   PE BP (!) 110/54   Pulse 74   Ht '5\' 9"'$  (1.753 m)   Wt 216 lb 12.8 oz (98.3 kg)   BMI 32.02 kg/m    Repeat blood pressure by me 110/70 supine and 104/66 standing.  Wt Readings from Last 3 Encounters:  05/15/18 216 lb 12.8 oz (98.3 kg)  05/05/18 225 lb (102.1 kg)  04/01/18 217 lb (98.4 kg)   General: Alert, oriented, no distress.  Skin: normal turgor, no rashes, warm and dry HEENT:  Normocephalic, atraumatic. Pupils equal round and reactive to light; sclera anicteric; extraocular muscles intact;  Nose without nasal septal hypertrophy Mouth/Parynx benign; Mallinpatti scale 3 Neck: No JVD, no carotid bruits; normal  carotid upstroke Lungs: clear to ausculatation and percussion; no wheezing or rales Chest wall: without tenderness to palpitation Heart: PMI not displaced, RRR, s1 s2 normal, 1/6 systolic murmur, no diastolic murmur, no rubs, gallops, thrills, or heaves Abdomen: soft, nontender; no hepatosplenomehaly, BS+; abdominal aorta nontender and not dilated by palpation. Back: no CVA tenderness Pulses 2+ Musculoskeletal: full range of motion, normal strength, no joint deformities Extremities: Difficult improvement in previous 3+ pitting edema, edema still present but now 1+; no clubbing, cyanosis or, Homan's sign negative  Neurologic: grossly nonfocal; Cranial nerves grossly wnl Psychologic: Normal mood and affect   ECG (independently read by me): Atrial fibrillation at 74 bpm.  Right bundle branch block with repolarization changes.  Left anterior hemiblock.  May 05, 2018 ECG (independently read by me): Atrial fibrillation at 80 bpm.  Right bundle branch block with repolarization changes.  May 2019 ECG (independently read by me): Atrial fibrillation at 75 bpm.  QT interval 438, QTc 489 ms.  Isolated PVC.  March 07, 2018 ECG (independently read by me): Atrial fibrillation at 84 with PVC or aberrantcy, right bundle branch block.  Old inferior Q waves in 3 and aVF.  November 2018 ECG (independently read by me): Normal sinus rhythm with isolated PAC.  Right bundle branch block with repolarization changes.  Inferior Q-wave  May 2018 ECG (independently read by me): Sinus rhythm at 70 bpm.  PAC.  Right bundle branch block with repolarization changes.  Q waves in lead 3 and aVF.  March 2017 ECG (independently read by me): Normal sinus rhythm at 87 bpm.  Right bundle branch block with repolarization changes.  Left axis deviation.  LVH voltage criteria.  Inferior Q waves , more prominent.  January 2016 ECG (independently read by me): Sinus rhythm at 64 bpm.  Right bundle branch block with repolarization  changes.  QTc interval 497 ms.  04/30/2014 ECG (independently read by me): Normal sinus rhythm at 73 beats per minute with right bundle branch block with repolarization changes.  QTc interval 467 ms.  Prior ECG: Normal sinus rhythm with previously noted right bundle branch block at 83 beats per minute. He noted inferior Q waves in III and F.  LABS: Reviewed the patient's recent hospitalization at [facility] and blood work done yesterday [facility].  BMP Latest Ref Rng & Units 05/05/2018 09/10/2012 09/09/2012  Glucose 65 - 99 mg/dL 142(H) 111(H) 146(H)  BUN 8 - 27 mg/dL 34(H) 10 13  Creatinine 0.76 - 1.27 mg/dL 1.52(H) 0.83 0.87  BUN/Creat Ratio 10 - 24 22 - -  Sodium 134 - 144 mmol/L 138 139 135  Potassium 3.5 - 5.2 mmol/L 4.5 3.8 3.4(L)  Chloride 96 - 106 mmol/L 97 104 100  CO2 20 - 29 mmol/L '22 28 29  '$ Calcium 8.6 - 10.2 mg/dL 9.3 8.3(L) 7.9(L)   Hepatic Function Latest Ref Rng & Units 05/05/2018 09/01/2012 09/29/2008  Total Protein 6.0 - 8.5 g/dL 6.5 6.6 5.9(L)  Albumin 3.5 - 4.7 g/dL 4.2 3.6 3.7  AST 0 - 40 IU/L '25 23 18  '$ ALT 0 - 44 IU/L '19 24 15  '$ Alk Phosphatase 39 - 117 IU/L 102 64 55  Total Bilirubin 0.0 - 1.2 mg/dL 0.7 0.7 1.2   CBC Latest Ref Rng & Units 09/10/2012 09/09/2012 09/01/2012  WBC  4.0 - 10.5 K/uL 12.0(H) 9.2 12.0(H)  Hemoglobin 13.0 - 17.0 g/dL 12.6(L) 12.1(L) 15.6  Hematocrit 39.0 - 52.0 % 37.4(L) 36.5(L) 44.9  Platelets 150 - 400 K/uL 174 140(L) 196   Lab Results  Component Value Date   MCV 86.4 09/10/2012   MCV 85.3 09/09/2012   MCV 84.6 09/01/2012   No results found for: TSH  Lipid Panel  No results found for: CHOL, TRIG, HDL, CHOLHDL, VLDL, LDLCALC, LDLDIRECT   RADIOLOGY: No results found.  IMPRESSION:  1. Essential hypertension   2. Persistent atrial fibrillation (HCC)   3. Cardiomyopathy as manifestation of underlying disease ([place])   4. Bilateral leg edema   5. Renal insufficiency   6. Anticoagulation adequate   7. Type  2 diabetes mellitus with diabetic polyneuropathy, without long-term current use of insulin ([place])   8. Medication management     ASSESSMENT AND PLAN: [name] is an 84 year old gentleman who has a history of mild nonobstructive CAD at cardiac catheterization in 2006 with approximately 20% LAD smooth narrowing proximally.  He denies any anginal symptoms.  His walking is limited due to arthritic symptoms and he walks with a cane.  In January 2019 he developed atrial fibrillation, beta-blocker therapy was increased, and he was started on anticoagulation with Eliquis.   He had been hospitalized with recent volume overload, questionable left lower lobe pneumonia and was treated with antibiotics and IV diuresis. His most recent echo Doppler study shows an EF of 45 to 50%.  He had mild left atrial dilation and moderate right atrial dilation.  Amiodarone was instituted for persistent atrial fibrillation.  He continues to be on amiodarone 200 mg daily, Eliquis 5 mg twice a day, furosemide 80 mg in the morning and 60 mg in the evening and recently has been on metolazone every third day after receiving every other day for 3 doses and his lisinopril dose had been reduced to 2.5 mg.  Is lost 9 pounds over this 10-day period.  His most recent creatinine drawn yesterday at his primary MD office had increased to 2.0.  Serum sodium was 134; potassium 4.3;  Mg 2.0.  I have recommended [name] discontinue lisinopril.  I will change his metolazone to 2.5 mg once a week.  I am reducing his Lasix dose to 80 mg in the morning but 40 mg in the afternoon.  In 1 week a Bmet and BNP will be obtained.    Hopefully if we can stabilize his volume status with intact renal function and he will do better if we can ultimately convert him back to sinus rhythm.  I see him in 4 weeks for reevaluation.   Time spent: 25 minutes  Troy Sine, MD, [facility]  05/17/2018 10:47 AM

## 2018-05-17 ENCOUNTER — Encounter: Payer: Self-pay | Admitting: Cardiovascular Disease

## 2018-06-11 ENCOUNTER — Ambulatory Visit (INDEPENDENT_AMBULATORY_CARE_PROVIDER_SITE_OTHER): Payer: Medicare Other | Admitting: Cardiovascular Disease

## 2018-06-11 ENCOUNTER — Encounter: Payer: Self-pay | Admitting: Cardiovascular Disease

## 2018-06-11 VITALS — BP 124/60 | HR 83 | Ht 69.0 in | Wt 218.8 lb

## 2018-06-11 DIAGNOSIS — R600 Localized edema: Secondary | ICD-10-CM

## 2018-06-11 DIAGNOSIS — I10 Essential (primary) hypertension: Secondary | ICD-10-CM

## 2018-06-11 DIAGNOSIS — Z7901 Long term (current) use of anticoagulants: Secondary | ICD-10-CM

## 2018-06-11 DIAGNOSIS — I2510 Atherosclerotic heart disease of native coronary artery without angina pectoris: Secondary | ICD-10-CM

## 2018-06-11 DIAGNOSIS — I43 Cardiomyopathy in diseases classified elsewhere: Secondary | ICD-10-CM

## 2018-06-11 DIAGNOSIS — I4819 Other persistent atrial fibrillation: Secondary | ICD-10-CM

## 2018-06-11 DIAGNOSIS — I481 Persistent atrial fibrillation: Secondary | ICD-10-CM

## 2018-06-11 DIAGNOSIS — N289 Disorder of kidney and ureter, unspecified: Secondary | ICD-10-CM

## 2018-06-11 MED ORDER — METOLAZONE 2.5 MG PO TABS: ORAL_TABLET | ORAL | 3 refills | Status: DC

## 2018-06-11 NOTE — Progress Notes (Signed)
Patient ID: [name], male   DOB: 1934-05-09, 84 y.o.   MRN: [id number]     Primary:  Ethan Scales, PA-C  HPI: [name] is a 84 y.o. male who presents to the office today for a 4 week follow-up cardiology evaluation.  [name]  has documented mild CAD by catheterization in 2006 which has been treated medically.   Additional problems include hypertension, type 2 diabetes mellitus, hyperlipidemia, obesity, and  peripheral neuropathy.  He underwent knee replacement surgery in 2013 well without cardiovascular compromise. An echo Doppler study  showed an EF of 55% with mild LVH, mild LA dilatation, mild to moderate mitral annular calcification trace MR, and mild pulmonary hypertension with mild TR with estimated pressure 32 mm. There was aortic sclerosis without stenosis with mild aortic insufficiency and mild pulmonic insufficiency.  When I last saw him in November 2018 he denied any  recurrent episodes of chest tightness.  He has chronic right bundle branch block.  He was unaware of palpitations.  He was on Crestor L and fish oil for hyperlipidemia, metformin for diabetes mellitus, and was taking torsemide 20 mg twice a day both for blood pressure and ankle edema in addition to Toprol-XL 50 mg and quinapril HCT 20/25 mg for hypertension control.  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  He was on Namenda for memory.   He was hospitalized at [facility] in January 2018 in the setting of dehydration and had experienced several falls.  He was found to be in atrial fibrillation with RVR.  He was started on Eliquis 5 mg twice a day.  His metoprolol dose was increased.  He was found to have liver steatosis.  An echo Doppler study showed an EF of 40% which was reduced from previously at 55%.  He apparently was rehospitalized with volume overload at [facility] overnight on February 27, 2018.  Was felt to have possible left lower lobe pneumonia on chest  x-ray in the emergency room.  Creatinine was 1.4.  He was felt to have acute on chronic systolic heart failure with EF around 45% and there was evidence for fluid overload.  He was placed on high-dose Lasix along with Zaroxolyn for 1 dose and additional medications were adjusted.  He was 1.9 at admission which improved.  He had follow-up lab work yesterday done at [facility] which now shows his creatinine at 1.05.  BNP was 367.  He is breathing better since his hospitalization.  He admits to an 11 pound weight loss.    I saw him March 07, 2018 for follow-up of his hospitalization.  He had lost 11 pounds since his hospitalization.  Continues to have 2-3+ lower extremity edema.  Renal function had improved.  I increase his Lasix to 80 mg in the morning and 60 mg in the afternoon.  He is feeling better.  His edema has improved.  Echo Doppler study from March 21, 2018 showed an EF of 45 to 50%.  There was diffuse hypo-kinesis, mild MR and mild left atrial dilatation with moderate right atrial dilatation.  PA peak pressure estimate was 35 mm.    I saw him in May 2019, he was still in atrial fibrillation on Eliquis.  I started amiodarone 200 mg twice a day for 3 days and then only recommended he stay on 200 mg daily.  QTc interval is increased and I recommended he reduce his Lexapro down to 5  mg.  Blood pressure was stable and he was continuing Lasix for diuresis.  When Isaw him on May 05, 2018  he continued to have 3+ pitting edema.  At that time I recommended initiation of metolazone 2.5 mg 30 minutes prior to his morning Lasix dose and do this every other day for 3 days and then change to every third day.  I reduced his lisinopril to 2.5 mg.  Laboratory was repeated and subsequent blood work 1 week later show his his creatinine had increased to 1.52.  Scheduled him for 1 week follow-up evaluation.    When I saw him for follow-up on May 15, 2018 and he had had significant improvement in his leg  swelling and was breathing better.  Work the day previously by his primary physician had shown an increase in creatinine at 2.0, sodium 134, potassium 4.3.  As result, at that office visit I reduced his metolazone to 2.5 mg weekly and reduced his Lasix to 80 mg in the morning and 40 mg in the afternoon.  A subsequent be met on May 19, 2018 showed improvement in his creatinine at 1.6.  He has noticed some slight increase in his pretibial edema.  His weight has been constant at 218.  He presents for reevaluation.  Past Medical History:  Diagnosis Date  . Arthritis   . Cancer ([name])    skin cancer,melonoma on nose  . Diabetes mellitus   . GERD (gastroesophageal reflux disease)   . Hyperlipidemia   . Hypertension   . Myocardial infarction ([place])    1990  . Neuromuscular disorder ([place])    neuropathy    Past Surgical History:  Procedure Laterality Date  . BACK SURGERY    . CERVICAL LAMINECTOMY    . JOINT REPLACEMENT     right knee  . PROSTATE SURGERY    . ROTATOR CUFF REPAIR    . SKIN CANCER EXCISION     eyelid and melonoma on nose  . TOTAL KNEE ARTHROPLASTY  09/08/2012   Procedure: TOTAL KNEE ARTHROPLASTY;  Surgeon: Rudean Haskell, MD;  Location: [place];  Service: Orthopedics;  Laterality: Left;  left total knee arthroplasty    No Known Allergies  Current Outpatient Medications  Medication Sig Dispense Refill  . allopurinol (ZYLOPRIM) 300 MG tablet Take 1 tablet by mouth daily.    Marland Kitchen amiodarone (PACERONE) 200 MG tablet Take 1 tablet (200 mg total) by mouth daily. 90 tablet 3  . ELIQUIS 5 MG TABS tablet Take 1 tablet (5 mg total) by mouth 2 (two) times daily. 60 tablet 6  . escitalopram (LEXAPRO) 10 MG tablet Take 0.5 tablets (5 mg total) by mouth daily. 45 tablet 3  . furosemide (LASIX) 40 MG tablet Take 2 tablets (80 mg total) by mouth every morning AND 1 tablet (40 mg total) every evening. 315 tablet 3  . gabapentin (NEURONTIN) 400 MG capsule Take 2 capsules by mouth daily.    .  Magnesium Oxide 400 MG CAPS Take 400 mg by mouth daily.    . metFORMIN (GLUCOPHAGE) 1000 MG tablet Take 1,000 mg by mouth 2 (two) times daily with a meal.    . metolazone (ZAROXOLYN) 2.5 MG tablet Take 1 tablet every 5 days (days that end in 0 and 5) 30 tablet 3  . metoprolol succinate (TOPROL-XL) 100 MG 24 hr tablet Take 1 tablet (100 mg total) by mouth daily. 30 tablet 6  . Multiple Vitamins-Minerals (ICAPS PO) Take by mouth.    Marland Kitchen  NAMENDA XR 28 MG CP24 24 hr capsule Take 1 capsule by mouth daily.    Marland Kitchen omeprazole (PRILOSEC) 20 MG capsule Take 20 mg by mouth daily.    Marland Kitchen oxyCODONE (OXY IR/ROXICODONE) 5 MG immediate release tablet Take 1-2 tablets (5-10 mg total) by mouth every 4 (four) hours as needed. 90 tablet 0  . potassium chloride SA (K-DUR,KLOR-CON) 20 MEQ tablet TAKE 1 TABLET BY MOUTH TWICE DAILY 60 tablet 11  . testosterone cypionate (DEPOTESTOSTERONE CYPIONATE) 200 MG/ML injection Inject 200 mg into the muscle every 14 (fourteen) days.     No current facility-administered medications for this visit.     Social History   Socioeconomic History  . Marital status: Married    Spouse name: Not on file  . Number of children: Not on file  . Years of education: Not on file  . Highest education level: Not on file  Occupational History  . Not on file  Social Needs  . Financial resource strain: Not on file  . Food insecurity:    Worry: Not on file    Inability: Not on file  . Transportation needs:    Medical: Not on file    Non-medical: Not on file  Tobacco Use  . Smoking status: Former Smoker    Packs/day: 1.00    Years: 12.00    Pack years: 12.00    Types: Cigarettes  . Smokeless tobacco: Former Systems developer    Types: Chew  Substance and Sexual Activity  . Alcohol use: No  . Drug use: No  . Sexual activity: Not on file  Lifestyle  . Physical activity:    Days per week: Not on file    Minutes per session: Not on file  . Stress: Not on file  Relationships  . Social connections:      Talks on phone: Not on file    Gets together: Not on file    Attends religious service: Not on file    Active member of club or organization: Not on file    Attends meetings of clubs or organizations: Not on file    Relationship status: Not on file  . Intimate partner violence:    Fear of current or ex partner: Not on file    Emotionally abused: Not on file    Physically abused: Not on file    Forced sexual activity: Not on file  Other Topics Concern  . Not on file  Social History Narrative  . Not on file   Socially, he is widowed. There is no tobacco history or alcohol. He completed 10th grade education. He is retired per he does use a cane.  Parents are deceased.  ROS General: Negative; No fevers, chills, or night sweats;  HEENT: Negative; No changes in vision or hearing, sinus congestion, difficulty swallowing Pulmonary: Negative; No cough, wheezing, shortness of breath, hemoptysis Cardiovascular: See HPI  lower extremity swelling. GI: Positive for GERD; No nausea, vomiting, diarrhea, or abdominal pain GU: Negative; No dysuria, hematuria, or difficulty voiding Musculoskeletal: Arthritic symptoms of the shoulders and hips; walks with a cane Hematologic/Oncology: Negative; no easy bruising, bleeding Endocrine: Positive for diabetes mellitus Neuro: Negative; no changes in balance, headaches Skin: Negative; No rashes or skin lesions Psychiatric: Negative; No behavioral problems, depression Sleep: Negative; No snoring, daytime sleepiness, hypersomnolence, bruxism, restless legs, hypnogognic hallucinations, no cataplexy Other comprehensive 14 point system review is negative.   PE BP 124/60   Pulse 83   Ht '5\' 9"'$  (1.753 m)  Wt 218 lb 12.8 oz (99.2 kg)   BMI 32.31 kg/m    Repeat blood pressure by me was 122/64 supine and 120/62 standing  Wt Readings from Last 3 Encounters:  06/11/18 218 lb 12.8 oz (99.2 kg)  05/15/18 216 lb 12.8 oz (98.3 kg)  05/05/18 225 lb (102.1  kg)   General: Alert, oriented, no distress.  Skin: normal turgor, no rashes, warm and dry HEENT: Normocephalic, atraumatic. Pupils equal round and reactive to light; sclera anicteric; extraocular muscles intact;  Nose without nasal septal hypertrophy Mouth/Parynx benign; Mallinpatti scale 3 Neck: No JVD, no carotid bruits; normal carotid upstroke Lungs: clear to ausculatation and percussion; no wheezing or rales Chest wall: without tenderness to palpitation Heart: PMI not displaced, RRR, s1 s2 normal, 1/6 systolic murmur, no diastolic murmur, no rubs, gallops, thrills, or heaves Abdomen: Moderate diastases recti;  soft, nontender; no hepatosplenomehaly, BS+; abdominal aorta nontender and not dilated by palpation. Back: no CVA tenderness Pulses 2+ Musculoskeletal: full range of motion, normal strength, no joint deformities Extremities:1-2+ pitting edema; no clubbing cyanosis, Homan's sign negative  Neurologic: grossly nonfocal; Cranial nerves grossly wnl Psychologic: Normal mood and affect   ECG (independently read by me): Atrial fibrillation at 83 bpm.  Right bundle branch block with repolarization changes.  Left anterior hemiblock.  May 15, 2018 ECG (independently read by me): Atrial fibrillation at 74 bpm.  Right bundle branch block with repolarization changes.  Left anterior hemiblock.  May 05, 2018 ECG (independently read by me): Atrial fibrillation at 80 bpm.  Right bundle branch block with repolarization changes.  May 2019 ECG (independently read by me): Atrial fibrillation at 75 bpm.  QT interval 438, QTc 489 ms.  Isolated PVC.  March 07, 2018 ECG (independently read by me): Atrial fibrillation at 82 with PVC or aberrantcy, right bundle branch block.  Old inferior Q waves in 3 and aVF.  November 2018 ECG (independently read by me): Normal sinus rhythm with isolated PAC.  Right bundle branch block with repolarization changes.  Inferior Q-wave  May 2018 ECG (independently read  by me): Sinus rhythm at 70 bpm.  PAC.  Right bundle branch block with repolarization changes.  Q waves in lead 3 and aVF.  March 2017 ECG (independently read by me): Normal sinus rhythm at 87 bpm.  Right bundle branch block with repolarization changes.  Left axis deviation.  LVH voltage criteria.  Inferior Q waves , more prominent.  January 2016 ECG (independently read by me): Sinus rhythm at 64 bpm.  Right bundle branch block with repolarization changes.  QTc interval 497 ms.  04/30/2014 ECG (independently read by me): Normal sinus rhythm at 73 beats per minute with right bundle branch block with repolarization changes.  QTc interval 467 ms.  Prior ECG: Normal sinus rhythm with previously noted right bundle branch block at 83 beats per minute. He noted inferior Q waves in III and F.  LABS: Reviewed the patient's recent hospitalization at [facility] and blood work done yesterday [facility].  BMP Latest Ref Rng & Units 05/05/2018 09/10/2012 09/09/2012  Glucose 65 - 99 mg/dL 142(H) 111(H) 146(H)  BUN 8 - 27 mg/dL 34(H) 10 13  Creatinine 0.76 - 1.27 mg/dL 1.52(H) 0.83 0.87  BUN/Creat Ratio 10 - 24 22 - -  Sodium 134 - 144 mmol/L 138 139 135  Potassium 3.5 - 5.2 mmol/L 4.5 3.8 3.4(L)  Chloride 96 - 106 mmol/L 97 104 100  CO2 20 - 29 mmol/L 22 28 29  Calcium 8.6 - 10.2 mg/dL 9.3 8.3(L) 7.9(L)   Hepatic Function Latest Ref Rng & Units 05/05/2018 09/01/2012 09/29/2008  Total Protein 6.0 - 8.5 g/dL 6.5 6.6 5.9(L)  Albumin 3.5 - 4.7 g/dL 4.2 3.6 3.7  AST 0 - 40 IU/L '25 23 18  '$ ALT 0 - 44 IU/L '19 24 15  '$ Alk Phosphatase 39 - 117 IU/L 102 64 55  Total Bilirubin 0.0 - 1.2 mg/dL 0.7 0.7 1.2   CBC Latest Ref Rng & Units 09/10/2012 09/09/2012 09/01/2012  WBC 4.0 - 10.5 K/uL 12.0(H) 9.2 12.0(H)  Hemoglobin 13.0 - 17.0 g/dL 12.6(L) 12.1(L) 15.6  Hematocrit 39.0 - 52.0 % 37.4(L) 36.5(L) 44.9  Platelets 150 - 400 K/uL 174 140(L) 196   Lab Results  Component Value Date   MCV 86.4  09/10/2012   MCV 85.3 09/09/2012   MCV 84.6 09/01/2012   No results found for: TSH  Lipid Panel  No results found for: CHOL, TRIG, HDL, CHOLHDL, VLDL, LDLCALC, LDLDIRECT   RADIOLOGY: No results found.  IMPRESSION:  1. Essential hypertension   2. Persistent atrial fibrillation (HCC)   3. Cardiomyopathy as manifestation of underlying disease ([place])   4. Bilateral leg edema   5. Renal insufficiency   6. Anticoagulation adequate     ASSESSMENT AND PLAN: [name] is an 84 year old gentleman who has a history of mild nonobstructive CAD at cardiac catheterization in 2006 with approximately 20% LAD smooth narrowing proximally.  He denies any anginal symptoms.  His walking is limited due to arthritic symptoms and he walks with a cane.  In January 2019 he developed atrial fibrillation, beta-blocker therapy was increased, and he was started on anticoagulation with Eliquis.   He had been hospitalized with recent volume overload, questionable left lower lobe pneumonia and was treated with antibiotics and IV diuresis. His most recent echo Doppler study shows an EF of 45 to 50%.  He had mild left atrial dilation and moderate right atrial dilation.  Amiodarone was instituted for persistent atrial fibrillation.  Presently, he is in atrial fibrillation with ventricular rate in the 70s to 80s.  His weight has remained stable at 218 since his last evaluation with only a 2 pound increase.  Previously, when his metolazone was being taken every third day he had diuresed well but had developed increasing creatinine.  He has now experienced improvement in renal function but the edema has slightly increased.  I will try to slightly change his metolazone and he will take 2.5 mg every fifth day on the 0 and 5 days of the monthly calendar.  Reviewed his recent laboratory [facility].  Potassium was 4.0.  Target weight is in the range of 2 12-2 15.  He will be seeing [name], [facility] on a monthly basis.  I  will see him in 3 months for reevaluation.  Time spenmt: 25 minutes  Troy Sine, MD, [facility]  06/13/2018 3:49 PM

## 2018-06-11 NOTE — Patient Instructions (Signed)
Medication Instructions:  Change metolazone to every 5 days (every day that ends in 0 and 5)  Follow-Up: 3 months with Dr. Claiborne Billings  Any Other Special Instructions Will Be Listed Below (If Applicable).     If you need a refill on your cardiac medications before your next appointment, please call your pharmacy.

## 2018-06-13 ENCOUNTER — Encounter: Payer: Self-pay | Admitting: Cardiovascular Disease

## 2018-08-11 ENCOUNTER — Other Ambulatory Visit: Payer: Self-pay | Admitting: Adult Health

## 2018-08-12 ENCOUNTER — Other Ambulatory Visit: Payer: Self-pay | Admitting: Pharmacist Clinician (PhC)/ Clinical Pharmacy Specialist

## 2018-08-12 MED ORDER — APIXABAN 2.5 MG PO TABS: 2.5000 mg | ORAL_TABLET | Freq: Two times a day (BID) | ORAL | 1 refills | Status: AC

## 2018-08-12 NOTE — Telephone Encounter (Signed)
Spoke with daughter, Shirlean Mylar, family aware of decrease in dose to 2.5 mg bid

## 2018-08-16 ENCOUNTER — Other Ambulatory Visit: Payer: Self-pay | Admitting: Adult Health

## 2018-09-11 ENCOUNTER — Encounter: Payer: Self-pay | Admitting: Cardiovascular Disease

## 2018-09-15 ENCOUNTER — Encounter: Payer: Self-pay | Admitting: Cardiovascular Disease

## 2018-09-15 ENCOUNTER — Ambulatory Visit (INDEPENDENT_AMBULATORY_CARE_PROVIDER_SITE_OTHER): Payer: Medicare Other | Admitting: Cardiovascular Disease

## 2018-09-15 VITALS — BP 122/67 | HR 64 | Ht 69.0 in | Wt 210.6 lb

## 2018-09-15 DIAGNOSIS — Z6834 Body mass index (BMI) 34.0-34.9, adult: Secondary | ICD-10-CM

## 2018-09-15 DIAGNOSIS — I43 Cardiomyopathy in diseases classified elsewhere: Secondary | ICD-10-CM

## 2018-09-15 DIAGNOSIS — E6609 Other obesity due to excess calories: Secondary | ICD-10-CM

## 2018-09-15 DIAGNOSIS — I2510 Atherosclerotic heart disease of native coronary artery without angina pectoris: Secondary | ICD-10-CM | POA: Diagnosis not present

## 2018-09-15 DIAGNOSIS — N289 Disorder of kidney and ureter, unspecified: Secondary | ICD-10-CM

## 2018-09-15 DIAGNOSIS — E785 Hyperlipidemia, unspecified: Secondary | ICD-10-CM

## 2018-09-15 DIAGNOSIS — I4510 Unspecified right bundle-branch block: Secondary | ICD-10-CM

## 2018-09-15 DIAGNOSIS — Z7901 Long term (current) use of anticoagulants: Secondary | ICD-10-CM

## 2018-09-15 DIAGNOSIS — I10 Essential (primary) hypertension: Secondary | ICD-10-CM

## 2018-09-15 DIAGNOSIS — I480 Paroxysmal atrial fibrillation: Secondary | ICD-10-CM

## 2018-09-15 MED ORDER — METOLAZONE 2.5 MG PO TABS: ORAL_TABLET | ORAL | 3 refills | Status: DC

## 2018-09-15 NOTE — Patient Instructions (Signed)
Medication Instructions:  Continue taking metolazone (Zaroxolyn) 2.5 mg every 2-3 days  If you need a refill on your cardiac medications before your next appointment, please call your pharmacy.   Follow-Up: At [facility], you and your health needs are our priority.  As part of our continuing mission to provide you with exceptional heart care, we have created designated Provider Care Teams.  These Care Teams include your primary Cardiologist (physician) and Advanced Practice Providers (APPs -  Physician Assistants and Nurse Practitioners) who all work together to provide you with the care you need, when you need it. You will need a follow up appointment in 4 months.  Please call our office 2 months in advance to schedule this appointment.  You may see Shelva Majestic, MD or one of the following Advanced Practice Providers on your designated Care Team: [place], [place] . Fabian Sharp, PA-C

## 2018-09-15 NOTE — Progress Notes (Signed)
Patient ID: [name], male   DOB: 1934/08/01, 84 y.o.   MRN: [id number]     Primary:  Ethan Scales, PA-C  HPI: [name] is a 84 y.o. male who presents to the office today for a 3 month follow-up cardiology evaluation.  [name]  has documented mild CAD by catheterization in 2006 which has been treated medically.   Additional problems include hypertension, type 2 diabetes mellitus, hyperlipidemia, obesity, and  peripheral neuropathy.  He underwent knee replacement surgery in 2013 well without cardiovascular compromise. An echo Doppler study  showed an EF of 55% with mild LVH, mild LA dilatation, mild to moderate mitral annular calcification trace MR, and mild pulmonary hypertension with mild TR with estimated pressure 32 mm. There was aortic sclerosis without stenosis with mild aortic insufficiency and mild pulmonic insufficiency.  When I last saw him in November 2018 he denied any  recurrent episodes of chest tightness.  He has chronic right bundle branch block.  He was unaware of palpitations.  He was on Crestor L and fish oil for hyperlipidemia, metformin for diabetes mellitus, and was taking torsemide 20 mg twice a day both for blood pressure and ankle edema in addition to Toprol-XL 50 mg and quinapril HCT 20/25 mg for hypertension control.  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  He was on Namenda for memory.   He was hospitalized at [facility] in January 2018 in the setting of dehydration and had experienced several falls.  He was found to be in atrial fibrillation with RVR.  He was started on Eliquis 5 mg twice a day.  His metoprolol dose was increased.  He was found to have liver steatosis.  An echo Doppler study showed an EF of 40% which was reduced from previously at 55%.  He apparently was rehospitalized with volume overload at [facility] overnight on February 27, 2018.  Was felt to have possible left lower lobe pneumonia on chest  x-ray in the emergency room.  Creatinine was 1.4.  He was felt to have acute on chronic systolic heart failure with EF around 45% and there was evidence for fluid overload.  He was placed on high-dose Lasix along with Zaroxolyn for 1 dose and additional medications were adjusted.  He was 1.9 at admission which improved.  He had follow-up lab work yesterday done at [facility] which now shows his creatinine at 1.05.  BNP was 367.  He is breathing better since his hospitalization.  He admits to an 11 pound weight loss.    I saw him March 07, 2018 for follow-up of his hospitalization.  He had lost 11 pounds since his hospitalization.  Continues to have 2-3+ lower extremity edema.  Renal function had improved.  I increase his Lasix to 80 mg in the morning and 60 mg in the afternoon.  He is feeling better.  His edema has improved.  Echo Doppler study from March 21, 2018 showed an EF of 45 to 50%.  There was diffuse hypo-kinesis, mild MR and mild left atrial dilatation with moderate right atrial dilatation.  PA peak pressure estimate was 35 mm.    I saw him in May 2019, he was still in atrial fibrillation on Eliquis.  I started amiodarone 200 mg twice a day for 3 days and then only recommended he stay on 200 mg daily.  QTc interval is increased and I recommended he reduce his Lexapro down to 5  mg.  Blood pressure was stable and he was continuing Lasix for diuresis.  When Isaw him on May 05, 2018  he continued to have 3+ pitting edema.  At that time I recommended initiation of metolazone 2.5 mg 30 minutes prior to his morning Lasix dose and do this every other day for 3 days and then change to every third day.  I reduced his lisinopril to 2.5 mg.  Laboratory was repeated and subsequent blood work 1 week later show his his creatinine had increased to 1.52.  Scheduled him for 1 week follow-up evaluation.    When I saw him for follow-up on May 15, 2018 and he had had significant improvement in his leg  swelling and was breathing better.  Laboratory by his primary physician had shown an increase in creatinine at 2.0, sodium 134, potassium 4.3.  As result, at that office visit I reduced his metolazone to 2.5 mg weekly and reduced his Lasix to 80 mg in the morning and 40 mg in the afternoon.  A subsequent bmet on May 19, 2018 showed improvement in his creatinine at 1.6.  He has noticed some slight increase in his pretibial edema.  His weight has been constant at 218.    I last saw him in July 2019 and he was in atrial fibrillation with ventricular rate in the 70s and his weight had been stable around 218.  Since I saw him, he has now been taking metolazone every third day since when he tried going every fifth day the swelling recurred.  He denies recent chest pain.  He denies bleeding on Eliquis.  He continues to be on rosuvastatin.  He had recent laboratory done at [facility] September 11, 2018.  Creatinine was 1.49.  LFTs minimally increased with an AST at 50 and a normal ALT at 33.  His estimated GFR was 43.  He was not anemic.  He presents for evaluation.  Past Medical History:  Diagnosis Date  . Arthritis   . Cancer ([place])    skin cancer,melonoma on nose  . Diabetes mellitus   . GERD (gastroesophageal reflux disease)   . Hyperlipidemia   . Hypertension   . Myocardial infarction ([place])    1990  . Neuromuscular disorder ([place])    neuropathy    Past Surgical History:  Procedure Laterality Date  . BACK SURGERY    . CERVICAL LAMINECTOMY    . JOINT REPLACEMENT     right knee  . PROSTATE SURGERY    . ROTATOR CUFF REPAIR    . SKIN CANCER EXCISION     eyelid and melonoma on nose  . TOTAL KNEE ARTHROPLASTY  09/08/2012   Procedure: TOTAL KNEE ARTHROPLASTY;  Surgeon: Rudean Haskell, MD;  Location: [facility];  Service: Orthopedics;  Laterality: Left;  left total knee arthroplasty    No Known Allergies  Current Outpatient Medications  Medication Sig Dispense Refill  . allopurinol  (ZYLOPRIM) 300 MG tablet Take 1 tablet by mouth daily.    Marland Kitchen amiodarone (PACERONE) 200 MG tablet Take 1 tablet (200 mg total) by mouth daily. 90 tablet 3  . apixaban (ELIQUIS) 2.5 MG TABS tablet Take 1 tablet (2.5 mg total) by mouth 2 (two) times daily. 180 tablet 1  . escitalopram (LEXAPRO) 10 MG tablet Take 0.5 tablets (5 mg total) by mouth daily. 45 tablet 3  . furosemide (LASIX) 40 MG tablet Take 40 mg by mouth 2 (two) times daily.    Marland Kitchen gabapentin (NEURONTIN) 400  MG capsule Take 2 capsules by mouth daily.    . Gabapentin Enacarbil (HORIZANT) 600 MG TBCR Take 1 tablet by mouth at bedtime.    . Magnesium Oxide 400 MG CAPS Take 400 mg by mouth daily.    . metFORMIN (GLUCOPHAGE-XR) 500 MG 24 hr tablet Take 1,000 mg by mouth 2 (two) times daily.  1  . metolazone (ZAROXOLYN) 2.5 MG tablet Take 1 tablet every 2-3 days 30 tablet 3  . metoprolol succinate (TOPROL-XL) 100 MG 24 hr tablet TAKE 1 TABLET BY MOUTH DAILY 30 tablet 6  . Multiple Vitamins-Minerals (ICAPS PO) Take by mouth.    Marland Kitchen NAMENDA XR 28 MG CP24 24 hr capsule Take 1 capsule by mouth daily.    Marland Kitchen omeprazole (PRILOSEC) 20 MG capsule Take 20 mg by mouth daily.    Marland Kitchen oxyCODONE (OXY IR/ROXICODONE) 5 MG immediate release tablet Take 1-2 tablets (5-10 mg total) by mouth every 4 (four) hours as needed. 90 tablet 0  . potassium chloride SA (K-DUR,KLOR-CON) 20 MEQ tablet TAKE 1 TABLET BY MOUTH TWICE DAILY 60 tablet 11  . rosuvastatin (CRESTOR) 5 MG tablet Take 5 mg by mouth daily.  1  . testosterone cypionate (DEPOTESTOSTERONE CYPIONATE) 200 MG/ML injection Inject 200 mg into the muscle every 14 (fourteen) days.     No current facility-administered medications for this visit.     Social History   Socioeconomic History  . Marital status: Married    Spouse name: Not on file  . Number of children: Not on file  . Years of education: Not on file  . Highest education level: Not on file  Occupational History  . Not on file  Social Needs  .  Financial resource strain: Not on file  . Food insecurity:    Worry: Not on file    Inability: Not on file  . Transportation needs:    Medical: Not on file    Non-medical: Not on file  Tobacco Use  . Smoking status: Former Smoker    Packs/day: 1.00    Years: 12.00    Pack years: 12.00    Types: Cigarettes  . Smokeless tobacco: Former Systems developer    Types: Chew  Substance and Sexual Activity  . Alcohol use: No  . Drug use: No  . Sexual activity: Not on file  Lifestyle  . Physical activity:    Days per week: Not on file    Minutes per session: Not on file  . Stress: Not on file  Relationships  . Social connections:    Talks on phone: Not on file    Gets together: Not on file    Attends religious service: Not on file    Active member of club or organization: Not on file    Attends meetings of clubs or organizations: Not on file    Relationship status: Not on file  . Intimate partner violence:    Fear of current or ex partner: Not on file    Emotionally abused: Not on file    Physically abused: Not on file    Forced sexual activity: Not on file  Other Topics Concern  . Not on file  Social History Narrative  . Not on file   Socially, he is widowed. There is no tobacco history or alcohol. He completed 10th grade education. He is retired per he does use a cane.  Parents are deceased.  ROS General: Negative; No fevers, chills, or night sweats;  HEENT: Negative; No changes in vision  or hearing, sinus congestion, difficulty swallowing Pulmonary: Negative; No cough, wheezing, shortness of breath, hemoptysis Cardiovascular: See HPI  lower extremity swelling. GI: Positive for GERD; No nausea, vomiting, diarrhea, or abdominal pain GU: Negative; No dysuria, hematuria, or difficulty voiding Musculoskeletal: Arthritic symptoms of the shoulders and hips; walks with a cane Hematologic/Oncology: Negative; no easy bruising, bleeding Endocrine: Positive for diabetes mellitus Neuro:  Negative; no changes in balance, headaches Skin: Negative; No rashes or skin lesions Psychiatric: Negative; No behavioral problems, depression Sleep: Negative; No snoring, daytime sleepiness, hypersomnolence, bruxism, restless legs, hypnogognic hallucinations, no cataplexy Other comprehensive 14 point system review is negative.   PE BP 122/67   Pulse 64   Ht _0  (1.753 m)   Wt 210 lb 9.6 oz (95.5 kg)   BMI 31.10 kg/m    Repeat blood pressure by me was 120/70 and he has lost 8 pounds since his last office visit  Wt Readings from Last 3 Encounters:  09/15/18 210 lb 9.6 oz (95.5 kg)  06/11/18 218 lb 12.8 oz (99.2 kg)  05/15/18 216 lb 12.8 oz (98.3 kg)   General: Alert, oriented, no distress.  Skin: normal turgor, no rashes, warm and dry HEENT: Normocephalic, atraumatic. Pupils equal round and reactive to light; sclera anicteric; extraocular muscles intact;  Nose without nasal septal hypertrophy Mouth/Parynx benign; Mallinpatti scale Neck: No JVD, no carotid bruits; normal carotid upstroke Lungs: clear to ausculatation and percussion; no wheezing or rales Chest wall: without tenderness to palpitation Heart: PMI not displaced, regular rate and rhythm, s1 s2 normal, 1/6 systolic murmur, no diastolic murmur, no rubs, gallops, thrills, or heaves Abdomen: Moderate diastases recti; soft, nontender; no hepatosplenomehaly, BS+; abdominal aorta nontender and not dilated by palpation. Back: no CVA tenderness Pulses 2+ Musculoskeletal: full range of motion, normal strength, no joint deformities Extremities: Improved pedal edema now trace to 1+ ; no clubbing cyanosis or edema, Homan's sign negative  Neurologic: grossly nonfocal; Cranial nerves grossly wnl Psychologic: Normal mood and affect   ECG (independently read by me): Normal sinus rhythm at 64 bpm.  Mild sinus arrhythmia.  First-degree AV block with appeared normal at 250 ms.  Right bundle branch block with repolarization changes.   Probable left anterior hemiblock  June 11, 2018 ECG (independently read by me): Atrial fibrillation at 83 bpm.  Right bundle branch block with repolarization changes.  Left anterior hemiblock.  May 15, 2018 ECG (independently read by me): Atrial fibrillation at 74 bpm.  Right bundle branch block with repolarization changes.  Left anterior hemiblock.  May 05, 2018 ECG (independently read by me): Atrial fibrillation at 80 bpm.  Right bundle branch block with repolarization changes.  May 2019 ECG (independently read by me): Atrial fibrillation at 75 bpm.  QT interval 438, QTc 489 ms.  Isolated PVC.  March 07, 2018 ECG (independently read by me): Atrial fibrillation at 84 with PVC or aberrantcy, right bundle branch block.  Old inferior Q waves in 3 and aVF.  November 2018 ECG (independently read by me): Normal sinus rhythm with isolated PAC.  Right bundle branch block with repolarization changes.  Inferior Q-wave  May 2018 ECG (independently read by me): Sinus rhythm at 70 bpm.  PAC.  Right bundle branch block with repolarization changes.  Q waves in lead 3 and aVF.  March 2017 ECG (independently read by me): Normal sinus rhythm at 87 bpm.  Right bundle branch block with repolarization changes.  Left axis deviation.  LVH voltage criteria.  Inferior Q waves ,  more prominent.  January 2016 ECG (independently read by me): Sinus rhythm at 64 bpm.  Right bundle branch block with repolarization changes.  QTc interval 497 ms.  04/30/2014 ECG (independently read by me): Normal sinus rhythm at 73 beats per minute with right bundle branch block with repolarization changes.  QTc interval 467 ms.  Prior ECG: Normal sinus rhythm with previously noted right bundle branch block at 83 beats per minute. He noted inferior Q waves in III and F.  LABS: Reviewed the patient's recent hospitalization at [facility] and blood work done at  [facility].  BMP Latest Ref Rng & Units 05/05/2018  09/10/2012 09/09/2012  Glucose 65 - 99 mg/dL 142(H) 111(H) 146(H)  BUN 8 - 27 mg/dL 34(H) 10 13  Creatinine 0.76 - 1.27 mg/dL 1.52(H) 0.83 0.87  BUN/Creat Ratio 10 - 24 22 - -  Sodium 134 - 144 mmol/L 138 139 135  Potassium 3.5 - 5.2 mmol/L 4.5 3.8 3.4(L)  Chloride 96 - 106 mmol/L 97 104 100  CO2 20 - 29 mmol/L _0 Calcium 8.6 - 10.2 mg/dL 9.3 8.3(L) 7.9(L)   Hepatic Function Latest Ref Rng & Units 05/05/2018 09/01/2012 09/29/2008  Total Protein 6.0 - 8.5 g/dL 6.5 6.6 5.9(L)  Albumin 3.5 - 4.7 g/dL 4.2 3.6 3.7  AST 0 - 40 IU/L _1 ALT 0 - 44 IU/L _2 Alk Phosphatase 39 - 117 IU/L 102 64 55  Total Bilirubin 0.0 - 1.2 mg/dL 0.7 0.7 1.2   CBC Latest Ref Rng & Units 09/10/2012 09/09/2012 09/01/2012  WBC 4.0 - 10.5 K/uL 12.0(H) 9.2 12.0(H)  Hemoglobin 13.0 - 17.0 g/dL 12.6(L) 12.1(L) 15.6  Hematocrit 39.0 - 52.0 % 37.4(L) 36.5(L) 44.9  Platelets 150 - 400 K/uL 174 140(L) 196   Lab Results  Component Value Date   MCV 86.4 09/10/2012   MCV 85.3 09/09/2012   MCV 84.6 09/01/2012   No results found for: TSH  Lipid Panel  No results found for: CHOL, TRIG, HDL, CHOLHDL, VLDL, LDLCALC, LDLDIRECT   RADIOLOGY: No results found.  IMPRESSION:  1. Essential hypertension   2. Cardiomyopathy as manifestation of underlying disease ([place])   3. Paroxysmal atrial fibrillation ([place])   4. Coronary artery disease involving native coronary artery of native heart without angina pectoris   5. Renal insufficiency   6. Hyperlipidemia with target LDL less than 70   7. Anticoagulation adequate   8. Class 1 obesity due to excess calories without serious comorbidity with body mass index (BMI) of 34.0 to 34.9 in adult   9. RBBB     ASSESSMENT AND PLAN: [name] is an 84 year old gentleman who has a history of mild nonobstructive CAD at cardiac catheterization in 2006 with approximately 20% LAD smooth narrowing proximally.  He denies any anginal symptoms.  His walking is limited due  to arthritic symptoms and he walks with a cane.  In January 2019 he developed atrial fibrillation, beta-blocker therapy was increased, and he was started on anticoagulation with Eliquis.   He had been hospitalized with recent volume overload, questionable left lower lobe pneumonia and was treated with antibiotics and IV diuresis. His most recent echo Doppler study shows an EF of 45 to 50%.  He had mild left atrial dilation and moderate right atrial dilation.  Amiodarone was instituted for persistent atrial fibrillation.  When I last saw him he was in atrial fibrillation with a controlled ventricular rate.  His ECG today shows  sinus rhythm.  He has lost 8 pounds of weight most likely due to volume.  He has been taking metolazone every third day but at times still notes some leg swelling.  I have suggested that he can be flexible and take the metolazone either every second or every third day depending upon his leg swelling.  He has stage III chronic kidney disease which has been relatively stable.  He is not having any anginal symptoms.  His blood pressure today is controlled.  He does not have bleeding on Eliquis.  He continues to be on very low-dose rosuvastatin 5 mg.  Lipid studies were not done on his most recent evaluation at [facility].  Clinically he is improved.  As long as he remains stable I will see him in 4 months for reevaluation.  Time spent: 25 minutes  Troy Sine, MD, [facility]  09/17/2018 5:37 PM

## 2018-09-17 ENCOUNTER — Encounter: Payer: Self-pay | Admitting: Cardiovascular Disease

## 2018-12-30 ENCOUNTER — Telehealth: Payer: Self-pay

## 2018-12-30 NOTE — Telephone Encounter (Signed)
Received notification from [facility] center of recent lab work. Spoke with Dr.Kelly, who agreed to change lasix to once daily. I sent a signed okay to change order back to facility. Will document change in our system.

## 2019-01-07 ENCOUNTER — Inpatient Hospital Stay (HOSPITAL_COMMUNITY)
Admission: EM | Admit: 2019-01-07 | Discharge: 2019-01-09 | DRG: 641 | Disposition: A | Discharge status: Home Health | Payer: Medicare Other | Attending: Internal Medicine | Admitting: Internal Medicine

## 2019-01-07 ENCOUNTER — Encounter (HOSPITAL_COMMUNITY): Payer: Self-pay | Admitting: Emergency Medicine

## 2019-01-07 ENCOUNTER — Emergency Department (HOSPITAL_COMMUNITY): Payer: Medicare Other

## 2019-01-07 ENCOUNTER — Other Ambulatory Visit: Payer: Self-pay

## 2019-01-07 DIAGNOSIS — R42 Dizziness and giddiness: Secondary | ICD-10-CM | POA: Diagnosis not present

## 2019-01-07 DIAGNOSIS — R296 Repeated falls: Secondary | ICD-10-CM | POA: Diagnosis present

## 2019-01-07 DIAGNOSIS — E860 Dehydration: Principal | ICD-10-CM | POA: Diagnosis present

## 2019-01-07 DIAGNOSIS — I5032 Chronic diastolic (congestive) heart failure: Secondary | ICD-10-CM | POA: Diagnosis not present

## 2019-01-07 DIAGNOSIS — Z7984 Long term (current) use of oral hypoglycemic drugs: Secondary | ICD-10-CM

## 2019-01-07 DIAGNOSIS — F329 Major depressive disorder, single episode, unspecified: Secondary | ICD-10-CM | POA: Diagnosis present

## 2019-01-07 DIAGNOSIS — E876 Hypokalemia: Secondary | ICD-10-CM | POA: Diagnosis present

## 2019-01-07 DIAGNOSIS — K219 Gastro-esophageal reflux disease without esophagitis: Secondary | ICD-10-CM | POA: Diagnosis present

## 2019-01-07 DIAGNOSIS — I130 Hypertensive heart and chronic kidney disease with heart failure and stage 1 through stage 4 chronic kidney disease, or unspecified chronic kidney disease: Secondary | ICD-10-CM | POA: Diagnosis present

## 2019-01-07 DIAGNOSIS — Z79899 Other long term (current) drug therapy: Secondary | ICD-10-CM

## 2019-01-07 DIAGNOSIS — I482 Chronic atrial fibrillation, unspecified: Secondary | ICD-10-CM | POA: Diagnosis present

## 2019-01-07 DIAGNOSIS — I252 Old myocardial infarction: Secondary | ICD-10-CM

## 2019-01-07 DIAGNOSIS — T502X5A Adverse effect of carbonic-anhydrase inhibitors, benzothiadiazides and other diuretics, initial encounter: Secondary | ICD-10-CM | POA: Diagnosis present

## 2019-01-07 DIAGNOSIS — W19XXXA Unspecified fall, initial encounter: Secondary | ICD-10-CM | POA: Diagnosis present

## 2019-01-07 DIAGNOSIS — M1990 Unspecified osteoarthritis, unspecified site: Secondary | ICD-10-CM | POA: Diagnosis present

## 2019-01-07 DIAGNOSIS — N183 Chronic kidney disease, stage 3 (moderate): Secondary | ICD-10-CM | POA: Diagnosis present

## 2019-01-07 DIAGNOSIS — Z96652 Presence of left artificial knee joint: Secondary | ICD-10-CM | POA: Diagnosis present

## 2019-01-07 DIAGNOSIS — E1142 Type 2 diabetes mellitus with diabetic polyneuropathy: Secondary | ICD-10-CM | POA: Diagnosis present

## 2019-01-07 DIAGNOSIS — E785 Hyperlipidemia, unspecified: Secondary | ICD-10-CM | POA: Diagnosis present

## 2019-01-07 DIAGNOSIS — E1122 Type 2 diabetes mellitus with diabetic chronic kidney disease: Secondary | ICD-10-CM | POA: Diagnosis present

## 2019-01-07 DIAGNOSIS — F0390 Unspecified dementia without behavioral disturbance: Secondary | ICD-10-CM | POA: Diagnosis present

## 2019-01-07 DIAGNOSIS — R269 Unspecified abnormalities of gait and mobility: Secondary | ICD-10-CM | POA: Diagnosis present

## 2019-01-07 DIAGNOSIS — Z7901 Long term (current) use of anticoagulants: Secondary | ICD-10-CM

## 2019-01-07 DIAGNOSIS — R262 Difficulty in walking, not elsewhere classified: Secondary | ICD-10-CM

## 2019-01-07 DIAGNOSIS — Z85828 Personal history of other malignant neoplasm of skin: Secondary | ICD-10-CM

## 2019-01-07 DIAGNOSIS — Z87891 Personal history of nicotine dependence: Secondary | ICD-10-CM

## 2019-01-07 DIAGNOSIS — T07XXXA Unspecified multiple injuries, initial encounter: Secondary | ICD-10-CM

## 2019-01-07 LAB — COMPREHENSIVE METABOLIC PANEL
ALT: 26 U/L (ref 0–44)
AST: 36 U/L (ref 15–41)
Albumin: 3.4 g/dL — ABNORMAL LOW (ref 3.5–5.0)
Alkaline Phosphatase: 167 U/L — ABNORMAL HIGH (ref 38–126)
Anion gap: 16 — ABNORMAL HIGH (ref 5–15)
BILIRUBIN TOTAL: 1.9 mg/dL — AB (ref 0.3–1.2)
BUN: 31 mg/dL — ABNORMAL HIGH (ref 8–23)
CO2: 28 mmol/L (ref 22–32)
CREATININE: 1.54 mg/dL — AB (ref 0.61–1.24)
Calcium: 8.8 mg/dL — ABNORMAL LOW (ref 8.9–10.3)
Chloride: 92 mmol/L — ABNORMAL LOW (ref 98–111)
GFR calc Af Amer: 48 mL/min — ABNORMAL LOW (ref >60)
GFR calc non Af Amer: 41 mL/min — ABNORMAL LOW (ref >60)
Glucose, Bld: 135 mg/dL — ABNORMAL HIGH (ref 70–99)
Potassium: 3.6 mmol/L (ref 3.5–5.1)
Sodium: 136 mmol/L (ref 135–145)
Total Protein: 6.2 g/dL — ABNORMAL LOW (ref 6.5–8.1)

## 2019-01-07 LAB — PROTIME-INR
INR: 1.24
Prothrombin Time: 15.4 seconds — ABNORMAL HIGH (ref 11.4–15.2)

## 2019-01-07 LAB — CBC WITH DIFFERENTIAL/PLATELET
Abs Immature Granulocytes: 0.04 10*3/uL (ref 0.00–0.07)
Basophils Absolute: 0 10*3/uL (ref 0.0–0.1)
Basophils Relative: 1 %
Eosinophils Absolute: 0.1 10*3/uL (ref 0.0–0.5)
Eosinophils Relative: 1 %
HCT: 39.8 % (ref 39.0–52.0)
Hemoglobin: 12.4 g/dL — ABNORMAL LOW (ref 13.0–17.0)
Immature Granulocytes: 1 %
Lymphocytes Relative: 14 %
Lymphs Abs: 0.8 10*3/uL (ref 0.7–4.0)
MCH: 26.2 pg (ref 26.0–34.0)
MCHC: 31.2 g/dL (ref 30.0–36.0)
MCV: 84.1 fL (ref 80.0–100.0)
Monocytes Absolute: 0.6 10*3/uL (ref 0.1–1.0)
Monocytes Relative: 10 %
Neutro Abs: 4.2 10*3/uL (ref 1.7–7.7)
Neutrophils Relative %: 73 %
Platelets: 124 10*3/uL — ABNORMAL LOW (ref 150–400)
RBC: 4.73 MIL/uL (ref 4.22–5.81)
RDW: 20.3 % — ABNORMAL HIGH (ref 11.5–15.5)
WBC: 5.7 10*3/uL (ref 4.0–10.5)
nRBC: 0 % (ref 0.0–0.2)

## 2019-01-07 LAB — URINALYSIS, ROUTINE W REFLEX MICROSCOPIC
Bilirubin Urine: NEGATIVE
Glucose, UA: NEGATIVE mg/dL
Hgb urine dipstick: NEGATIVE
KETONES UR: NEGATIVE mg/dL
Leukocytes,Ua: NEGATIVE
Nitrite: NEGATIVE
PROTEIN: NEGATIVE mg/dL
Specific Gravity, Urine: 1.012 (ref 1.005–1.030)
pH: 7 (ref 5.0–8.0)

## 2019-01-07 MED ORDER — AMIODARONE HCL 200 MG PO TABS
200.0000 mg | ORAL_TABLET | Freq: Every day | ORAL | Status: DC
  Administered 2019-01-08 – 2019-01-09 (×3): 200 mg via ORAL
  Filled 2019-01-07 (×3): qty 1

## 2019-01-07 MED ORDER — SODIUM CHLORIDE 0.9 % IV BOLUS
1000.0000 mL | Freq: Once | INTRAVENOUS | Status: AC
  Administered 2019-01-07: 1000 mL via INTRAVENOUS

## 2019-01-07 MED ORDER — MAGNESIUM OXIDE 400 MG PO CAPS: 400.0000 mg | ORAL_CAPSULE | Freq: Every day | ORAL | Status: DC

## 2019-01-07 MED ORDER — ESCITALOPRAM OXALATE 10 MG PO TABS
5.0000 mg | ORAL_TABLET | Freq: Every day | ORAL | Status: DC
  Administered 2019-01-07 – 2019-01-09 (×3): 5 mg via ORAL
  Filled 2019-01-07 (×3): qty 1

## 2019-01-07 MED ORDER — METOPROLOL SUCCINATE ER 100 MG PO TB24
100.0000 mg | ORAL_TABLET | Freq: Every day | ORAL | Status: DC
  Administered 2019-01-07 – 2019-01-09 (×3): 100 mg via ORAL
  Filled 2019-01-07 (×3): qty 1

## 2019-01-07 MED ORDER — OXYCODONE HCL 5 MG PO TABS: 5.0000 mg | ORAL_TABLET | ORAL | Status: DC | PRN

## 2019-01-07 MED ORDER — GABAPENTIN ENACARBIL ER 600 MG PO TBCR
1.0000 | EXTENDED_RELEASE_TABLET | Freq: Every day | ORAL | Status: DC
  Administered 2019-01-07 – 2019-01-08 (×2): 600 mg via ORAL
  Filled 2019-01-07 (×2): qty 1

## 2019-01-07 MED ORDER — ROSUVASTATIN CALCIUM 5 MG PO TABS
5.0000 mg | ORAL_TABLET | Freq: Every day | ORAL | Status: DC
  Administered 2019-01-07 – 2019-01-09 (×3): 5 mg via ORAL
  Filled 2019-01-07 (×3): qty 1

## 2019-01-07 MED ORDER — MEMANTINE HCL ER 28 MG PO CP24
28.0000 mg | ORAL_CAPSULE | Freq: Every day | ORAL | Status: DC
  Filled 2019-01-07 (×4): qty 1

## 2019-01-07 MED ORDER — PANTOPRAZOLE SODIUM 40 MG PO TBEC
40.0000 mg | DELAYED_RELEASE_TABLET | Freq: Every day | ORAL | Status: DC
  Administered 2019-01-07 – 2019-01-09 (×3): 40 mg via ORAL
  Filled 2019-01-07 (×3): qty 1

## 2019-01-07 MED ORDER — SODIUM CHLORIDE 0.9 % IV BOLUS
500.0000 mL | Freq: Once | INTRAVENOUS | Status: AC
  Administered 2019-01-07: 500 mL via INTRAVENOUS

## 2019-01-07 MED ORDER — ACETAMINOPHEN 325 MG PO TABS: 650.0000 mg | ORAL_TABLET | Freq: Four times a day (QID) | ORAL | Status: DC | PRN

## 2019-01-07 MED ORDER — MAGNESIUM OXIDE 400 (241.3 MG) MG PO TABS
400.0000 mg | ORAL_TABLET | Freq: Every day | ORAL | Status: DC
  Administered 2019-01-07 – 2019-01-09 (×3): 400 mg via ORAL
  Filled 2019-01-07 (×3): qty 1

## 2019-01-07 MED ORDER — DEXTROSE-NACL 5-0.9 % IV SOLN
INTRAVENOUS | Status: DC
  Administered 2019-01-07: 20:00:00 via INTRAVENOUS

## 2019-01-07 MED ORDER — APIXABAN 2.5 MG PO TABS
2.5000 mg | ORAL_TABLET | Freq: Two times a day (BID) | ORAL | Status: DC
  Administered 2019-01-07 – 2019-01-09 (×4): 2.5 mg via ORAL
  Filled 2019-01-07 (×4): qty 1

## 2019-01-07 MED ORDER — ACETAMINOPHEN 650 MG RE SUPP: 650.0000 mg | Freq: Four times a day (QID) | RECTAL | Status: DC | PRN

## 2019-01-07 MED ORDER — ALLOPURINOL 300 MG PO TABS
300.0000 mg | ORAL_TABLET | Freq: Every day | ORAL | Status: DC
  Administered 2019-01-07 – 2019-01-09 (×3): 300 mg via ORAL
  Filled 2019-01-07 (×3): qty 1

## 2019-01-07 MED ORDER — GABAPENTIN 400 MG PO CAPS: 800.0000 mg | ORAL_CAPSULE | Freq: Every day | ORAL | Status: DC

## 2019-01-07 NOTE — H&P (Signed)
History and Physical    [name] EVO:[id number] DOB: 15-Jan-1934 DOA: 01/07/2019  PCP: Ethan Riches, NP   Patient coming from: Home   Chief Complaint: Frequent falls.   HPI: [name] is a 85 y.o. male with medical history significant of  hypertension, diabetes mellitus type 2, dyslipidemia, obesity, peripheral neuropathy, diastolic heart failure and atrial fibrillation.  Patient presents with 7-day course of frequent falls, he was brought by his daughter due to persistent symptoms.  He describes orthostatic symptoms, feeling dizzy and lightheaded after standing, from a sitting position, then triggering frequent falls.  He has had head trauma but no loss of consciousness.  Denies any angina, PND, orthopnea lower extremity edema.  For the last 6 months his appetite has decreased significantly and he has lost about 8 pounds.  He is on a aggressive diuretic regimen at home.  His falls have been recurrent, persistent for last 7 days, moderate to severe intensity, triggered by orthostatic symptoms, no improvingtor worsening factors.   He was seen at the cardiology clinic October 2019, he was advised to use metolazone on a flexible manner depending on lower extremity edema.  ED Course: Patient was found very deconditioned, weak and dehydrated.  After IV fluids he was still symptomatic, unable to ambulate, he was referred for admission for evaluation.  Review of Systems:  1. General: No fevers, no chills, no weight gain, but 8 lbs weight loss over last 6 mo. 2. ENT: No runny nose or sore throat, no hearing disturbances 3. Pulmonary: No dyspnea, cough, wheezing, or hemoptysis 4. Cardiovascular: No angina, claudication, lower extremity edema, pnd or orthopnea 5. Gastrointestinal: No nausea or vomiting, no diarrhea or constipation 6. Hematology: No easy bruisability or frequent infections 7. Urology: No dysuria, hematuria or increased urinary frequency 8. Dermatology: No rashes. 9.  Neurology: No seizures or paresthesias/ generalized weakness and frequent falls as mentioned in HPI.  10. Musculoskeletal: No joint pain or deformities  Past Medical History:  Diagnosis Date  . Arthritis   . 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 Diabetes mellitus   . GERD (gastroesophageal reflux disease)   . Hyperlipidemia   . Hypertension   . Myocardial infarction ([place])    1990  . Neuromuscular disorder ([place])    neuropathy    Past Surgical History:  Procedure Laterality Date  . BACK SURGERY    . CERVICAL LAMINECTOMY    . JOINT REPLACEMENT     right knee  . PROSTATE SURGERY    . ROTATOR CUFF REPAIR    . SKIN CANCER EXCISION     eyelid and melonoma on nose  . TOTAL KNEE ARTHROPLASTY  09/08/2012   Procedure: TOTAL KNEE ARTHROPLASTY;  Surgeon: Rudean Haskell, MD;  Location: [facility];  Service: Orthopedics;  Laterality: Left;  left total knee arthroplasty     reports that he has quit smoking. His smoking use included cigarettes. He has a 12.00 pack-year smoking history. He has quit using smokeless tobacco.  His smokeless tobacco use included chew. He reports that he does not drink alcohol or use drugs.  No Known Allergies  History reviewed. No pertinent family history.   Prior to Admission medications   Medication Sig Start Date End Date Taking? Authorizing Provider  allopurinol (ZYLOPRIM) 300 MG tablet Take 1 tablet by mouth daily. 01/10/16   [provider]  amiodarone (PACERONE) 200 MG tablet Take 1 tablet (200 mg total) by mouth daily. 04/01/18   Claiborne Billings,  Joyice Faster, MD  apixaban (ELIQUIS) 2.5 MG TABS tablet Take 1 tablet (2.5 mg total) by mouth 2 (two) times daily. 08/12/18   Troy Sine, MD  escitalopram (LEXAPRO) 10 MG tablet Take 0.5 tablets (5 mg total) by mouth daily. 04/01/18   Troy Sine, MD  furosemide (LASIX) 40 MG tablet Take 40 mg by mouth daily.    [provider]  gabapentin (NEURONTIN) 400 MG capsule Take 2 capsules by mouth  daily. 12/16/17   [provider]  Gabapentin Enacarbil (HORIZANT) 600 MG TBCR Take 1 tablet by mouth at bedtime.    [provider]  Magnesium Oxide 400 MG CAPS Take 400 mg by mouth daily.    [provider]  metFORMIN (GLUCOPHAGE-XR) 500 MG 24 hr tablet Take 1,000 mg by mouth 2 (two) times daily. 07/14/18   [provider]  metolazone (ZAROXOLYN) 2.5 MG tablet Take 1 tablet every 2-3 days 09/15/18   Troy Sine, MD  metoprolol succinate (TOPROL-XL) 100 MG 24 hr tablet TAKE 1 TABLET BY MOUTH DAILY 08/18/18   Lendon Colonel, NP  Multiple Vitamins-Minerals (ICAPS PO) Take by mouth.    [provider]  NAMENDA XR 28 MG CP24 24 hr capsule Take 1 capsule by mouth daily. 01/10/16   [provider]  omeprazole (PRILOSEC) 20 MG capsule Take 20 mg by mouth daily.    [provider]  oxyCODONE (OXY IR/ROXICODONE) 5 MG immediate release tablet Take 1-2 tablets (5-10 mg total) by mouth every 4 (four) hours as needed. 09/09/12   Carlynn Spry, PA-C  potassium chloride SA (K-DUR,KLOR-CON) 20 MEQ tablet TAKE 1 TABLET BY MOUTH TWICE DAILY 04/17/18   Troy Sine, MD  rosuvastatin (CRESTOR) 5 MG tablet Take 5 mg by mouth daily. 09/04/18   [provider]  testosterone cypionate (DEPOTESTOSTERONE CYPIONATE) 200 MG/ML injection Inject 200 mg into the muscle every 14 (fourteen) days. 02/11/17   [provider]    Physical Exam: Vitals:   01/07/19 1430 01/07/19 1515 01/07/19 1530 01/07/19 1545  BP: (!) 108/53 (!) 116/54 (!) 117/51 (!) 100/57  Pulse:   71 70  Resp: 12 20 (!) 21 12  SpO2:   97% 95%  Weight:      Height:        Vitals:   01/07/19 1430 01/07/19 1515 01/07/19 1530 01/07/19 1545  BP: (!) 108/53 (!) 116/54 (!) 117/51 (!) 100/57  Pulse:   71 70  Resp: 12 20 (!) 21 12  SpO2:   97% 95%  Weight:      Height:       General: deconditioned and ill looking appearing.  Neurology: Awake and alert, non focal Head  and Neck. Head normocephalic. Neck supple with no adenopathy or thyromegaly.   E ENT: positive pallor, no icterus, oral mucosa dry Cardiovascular: No JVD. S1-S2 present, irregularly irregular, no gallops, rubs, or murmurs. +/ ++ pitting lower extremity edema. Pulmonary: positive breath sounds bilaterally, adequate air movement, no wheezing, rhonchi or rales. Gastrointestinal. Abdomen protuberant with no organomegaly, non tender, no rebound or guarding Skin. No rashes Musculoskeletal: no joint deformities    Labs on Admission: I have personally reviewed following labs and imaging studies  CBC: Recent Labs  Lab 01/07/19 1159  WBC 5.7  NEUTROABS 4.2  HGB 12.4*  HCT 39.8  MCV 84.1  PLT 073*   Basic Metabolic Panel: Recent Labs  Lab 01/07/19 1159  NA 136  K 3.6  CL 92*  CO2  28  GLUCOSE 135*  BUN 31*  CREATININE 1.54*  CALCIUM 8.8*   GFR: Estimated Creatinine Clearance: 42.7 mL/min (A) (by C-G formula based on SCr of 1.54 mg/dL (H)). Liver Function Tests: Recent Labs  Lab 01/07/19 1159  AST 36  ALT 26  ALKPHOS 167*  BILITOT 1.9*  PROT 6.2*  ALBUMIN 3.4*   No results for input(s): LIPASE, AMYLASE in the last 168 hours. No results for input(s): AMMONIA in the last 168 hours. Coagulation Profile: Recent Labs  Lab 01/07/19 1159  INR 1.24   Cardiac Enzymes: No results for input(s): CKTOTAL, CKMB, CKMBINDEX, TROPONINI in the last 168 hours. BNP (last 3 results) No results for input(s): PROBNP in the last 8760 hours. HbA1C: No results for input(s): HGBA1C in the last 72 hours. CBG: No results for input(s): GLUCAP in the last 168 hours. Lipid Profile: No results for input(s): CHOL, HDL, LDLCALC, TRIG, CHOLHDL, LDLDIRECT in the last 72 hours. Thyroid Function Tests: No results for input(s): TSH, T4TOTAL, FREET4, T3FREE, THYROIDAB in the last 72 hours. Anemia Panel: No results for input(s): VITAMINB12, FOLATE, FERRITIN, TIBC, IRON, RETICCTPCT in the last 72  hours. Urine analysis:    Component Value Date/Time   COLORURINE YELLOW 01/07/2019 1414   APPEARANCEUR CLEAR 01/07/2019 1414   LABSPEC 1.012 01/07/2019 1414   PHURINE 7.0 01/07/2019 1414   GLUCOSEU NEGATIVE 01/07/2019 1414   HGBUR NEGATIVE 01/07/2019 1414   BILIRUBINUR NEGATIVE 01/07/2019 1414   KETONESUR NEGATIVE 01/07/2019 1414   PROTEINUR NEGATIVE 01/07/2019 1414   UROBILINOGEN 0.2 09/01/2012 1026   NITRITE NEGATIVE 01/07/2019 1414   LEUKOCYTESUR NEGATIVE 01/07/2019 1414    Radiological Exams on Admission: Dg Chest 2 View  Result Date: 01/07/2019 CLINICAL DATA:  Fall EXAM: CHEST - 2 VIEW COMPARISON:  07/01/2018 chest radiograph. FINDINGS: Partially visualized surgical hardware from ACDF overlying the lower cervical spine. Stable cardiomediastinal silhouette with mild cardiomegaly. No pneumothorax. No pleural effusion. Lungs appear clear, with no acute consolidative airspace disease and no pulmonary edema. No displaced fractures in the visualized chest. IMPRESSION: Mild cardiomegaly. No pulmonary edema. No active pulmonary disease. Electronically Signed   By: Ilona Sorrel M.D.   On: 01/07/2019 13:31   Dg Lumbar Spine Complete  Result Date: 01/07/2019 CLINICAL DATA:  Low back pain after multiple falls. EXAM: LUMBAR SPINE - COMPLETE 4+ VIEW COMPARISON:  CT scan of December 14, 2017. FINDINGS: No fracture or spondylolisthesis is noted. Moderate degenerative disc disease is noted at at L2-3, L3-4, L4-5 and L5-S1. Mild degenerative disc disease is noted at L1-2. IMPRESSION: Multilevel degenerative disc disease. No acute abnormality seen in the lumbar spine. Electronically Signed   By: Marijo Conception, M.D.   On: 01/07/2019 13:39   Ct Head Wo Contrast  Result Date: 01/07/2019 CLINICAL DATA:  Recurrent falls over the past year in an anticoagulated patient. Weakness and legs giving way. EXAM: CT HEAD WITHOUT CONTRAST TECHNIQUE: Contiguous axial images were obtained from the base of the skull  through the vertex without intravenous contrast. COMPARISON:  Head CT scan 08/29/2018 and 12/13/2017. FINDINGS: Brain: No evidence of acute infarction, hemorrhage, hydrocephalus, extra-axial collection or mass lesion/mass effect. Atrophy and chronic microvascular ischemic change noted. Vascular: No hyperdense vessel or unexpected calcification. Skull: Intact.  No focal lesion. Sinuses/Orbits: Status post cataract surgery.  Otherwise negative. Other: None. IMPRESSION: No acute abnormality. Atrophy and chronic microvascular ischemic change. Electronically Signed   By: Inge Rise M.D.   On: 01/07/2019 13:16    EKG: Independently reviewed.  EKG atrial fibrillation, left axis deviation, right bundle branch block.  Assessment/Plan Active Problems:   Ambulatory dysfunction  85 year old male with significant history for diastolic heart failure and atrial fibrillation who is on chronic anticoagulation and an aggressive home diuretic regimen, who presents with frequent falls for the last 7 days, apparently triggered by orthostatic symptoms.  No frank syncope.  On his initial physical examination blood pressure 119/58, heart rate 92, respiratory 23, oxygen saturation 96%.  He has mild pallor, dry mucous membranes, lungs clear to auscultation bilaterally, heart S1-S2 present, irregularly irregular, soft abdomen, +/++ bilateral pitting edema.  Sodium 136, potassium 3.6, chloride 92, bicarb 28, glucose 135, BUN 31, creatinine 1.54, AST 36, ALT 26, white count 5.7, hemoglobin 12.4, hematocrit 39.8, platelets 124, INR 1.24.  Urine analysis negative for infection.  Head CT with atrophy and chronic microvascular ischemic changes, no acute abnormalities.  Chest x-ray with cardiomegaly.  Patient will be admitted to the hospital working diagnosis of dehydration complicated by amatory dysfunction.  1.  Acute dehydration complicated by orthostatic symptoms and frequent falls.  Patient will be admitted to the medical  ward, he will placed on isotonic saline with dextrose 75 ml-hour for next 24 hours.  Plan to check orthostatics in the morning.  Follow-up with physical therapy.  Hold on diuretics for now.  2.  Chronic diastolic heart failure.  Seems to be stable no signs of exacerbation, he does have trace lower extremity edema possible venous insufficiency.  Hold on diuretic therapy.  3.  Chronic atrial fibrillation.  Continue rate control with metoprolol and amiodarone, continue anticoagulation with apixaban.  4.  Chronic kidney disease stage III.  Gentle hydration with isotonic saline, hold furosemide and metolazone.  Follow-up kidney function morning.  Avoid hypotension and nephrotoxic agents.  Likely his diuretic regimen will need to be adjusted.  5.  Dementia.  Continue Namenda and escitalopram.  Patient has had significant weight loss over the last 6 months.  6.  Type 2 diabetes mellitus.  Admission glucose 135, continue diet control.  Hold insulin for now.   DVT prophylaxis:  apixaban  Code Status:  full  Family Communication: I spoke with patient's daughter at the bedside and all questions were addressed.   Disposition Plan:  Med surg   Consults called: none   Admission status: Observation.     Audyn Dimercurio Gerome Apley MD [facility]   01/07/2019, 3:53 PM

## 2019-01-07 NOTE — ED Triage Notes (Signed)
Daughter states patient has been falling for the last 1 year, large bruise to the inner aspect of his left upper arm. Patient is on blood thinners. States he is getting weak and his legs give away, however his daughter states patient is c/o being dizzy at times.

## 2019-01-07 NOTE — ED Provider Notes (Signed)
[facility] EMERGENCY DEPARTMENT Provider Note   CSN: [id number] Arrival date & time: 01/07/19  1145     History   Chief Complaint Chief Complaint  Patient presents with  . Fall    HPI [name] is a 85 y.o. male.  Pt presents to the ED today with frequent falls.  The pt has been falling intermittently for the last year, but it has gotten worse over the last few days.  The pt has been weaker and has occasional dizziness.  Pt is on Eliquis for afib.  Pt c/o low back from recent fall.  CHA2DS2/VAS Stroke Risk Points  Current as of 10 minutes ago     6 >= 2 Points: High Risk  1 - 1.99 Points: Medium Risk  0 Points: Low Risk    This is the only CHA2DS2/VAS Stroke Risk Points available for the past  year.:  Last Change: N/A     Details    This score determines the patient's risk of having a stroke if the  patient has atrial fibrillation.       Points Metrics  1 Has Congestive Heart Failure:  Yes    Current as of 10 minutes ago  1 Has Vascular Disease:  Yes    Current as of 10 minutes ago  1 Has Hypertension:  Yes    Current as of 10 minutes ago  2 Age:  78    Current as of 10 minutes ago  1 Has Diabetes:  Yes    Current as of 10 minutes ago  0 Had Stroke:  No  Had TIA:  No  Had thromboembolism:  No    Current as of 10 minutes ago  0 Male:  No    Current as of 10 minutes ago        Past Medical History:  Diagnosis Date  . Arthritis   . Cancer ([place])    skin cancer,melonoma on nose  . Diabetes mellitus   . GERD (gastroesophageal reflux disease)   . Hyperlipidemia   . Hypertension   . Myocardial infarction ([place])    1990  . Neuromuscular disorder [facility])    neuropathy    Patient Active Problem List   Diagnosis Date Noted  . Ambulatory dysfunction 01/07/2019  . Osteoarthritis 05/21/2014  . Bilateral lower extremity edema 05/21/2014  . Bilateral leg edema 10/14/2013  . Arthritis 10/14/2013  . RBBB 10/14/2013  . CAD (coronary  artery disease) 04/16/2013  . 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 Hypertensive heart disease 04/16/2013  . DM2 (diabetes mellitus, type 2) ([place]) 04/16/2013  . Gout 04/16/2013  . Hyperlipidemia with target LDL less than 70 04/16/2013    Past Surgical History:  Procedure Laterality Date  . BACK SURGERY    . CERVICAL LAMINECTOMY    . JOINT REPLACEMENT     right knee  . PROSTATE SURGERY    . ROTATOR CUFF REPAIR    . SKIN CANCER EXCISION     eyelid and melonoma on nose  . TOTAL KNEE ARTHROPLASTY  09/08/2012   Procedure: TOTAL KNEE ARTHROPLASTY;  Surgeon: Rudean Haskell, MD;  Location: [place];  Service: Orthopedics;  Laterality: Left;  left total knee arthroplasty        Home Medications    Prior to Admission medications   Medication Sig Start Date End Date Taking? Authorizing Provider  allopurinol (ZYLOPRIM) 300 MG tablet Take 1 tablet by mouth daily. 01/10/16   [provider]  amiodarone (PACERONE) 200  MG tablet Take 1 tablet (200 mg total) by mouth daily. 04/01/18   Troy Sine, MD  apixaban (ELIQUIS) 2.5 MG TABS tablet Take 1 tablet (2.5 mg total) by mouth 2 (two) times daily. 08/12/18   Troy Sine, MD  escitalopram (LEXAPRO) 10 MG tablet Take 0.5 tablets (5 mg total) by mouth daily. 04/01/18   Troy Sine, MD  furosemide (LASIX) 40 MG tablet Take 40 mg by mouth daily.    [provider]  gabapentin (NEURONTIN) 400 MG capsule Take 2 capsules by mouth daily. 12/16/17   [provider]  Gabapentin Enacarbil (HORIZANT) 600 MG TBCR Take 1 tablet by mouth at bedtime.    [provider]  Magnesium Oxide 400 MG CAPS Take 400 mg by mouth daily.    [provider]  metFORMIN (GLUCOPHAGE-XR) 500 MG 24 hr tablet Take 1,000 mg by mouth 2 (two) times daily. 07/14/18   [provider]  metolazone (ZAROXOLYN) 2.5 MG tablet Take 1 tablet every 2-3 days 09/15/18   Troy Sine, MD  metoprolol succinate (TOPROL-XL) 100 MG 24 hr tablet TAKE 1  TABLET BY MOUTH DAILY 08/18/18   Lendon Colonel, NP  Multiple Vitamins-Minerals (ICAPS PO) Take by mouth.    [provider]  NAMENDA XR 28 MG CP24 24 hr capsule Take 1 capsule by mouth daily. 01/10/16   [provider]  omeprazole (PRILOSEC) 20 MG capsule Take 20 mg by mouth daily.    [provider]  oxyCODONE (OXY IR/ROXICODONE) 5 MG immediate release tablet Take 1-2 tablets (5-10 mg total) by mouth every 4 (four) hours as needed. 09/09/12   Carlynn Spry, PA-C  potassium chloride SA (K-DUR,KLOR-CON) 20 MEQ tablet TAKE 1 TABLET BY MOUTH TWICE DAILY 04/17/18   Troy Sine, MD  rosuvastatin (CRESTOR) 5 MG tablet Take 5 mg by mouth daily. 09/04/18   [provider]  testosterone cypionate (DEPOTESTOSTERONE CYPIONATE) 200 MG/ML injection Inject 200 mg into the muscle every 14 (fourteen) days. 02/11/17   [provider]    Family History History reviewed. No pertinent family history.  Social History Social History   Tobacco Use  . Smoking status: Former Smoker    Packs/day: 1.00    Years: 12.00    Pack years: 12.00    Types: Cigarettes  . Smokeless tobacco: Former Systems developer    Types: Chew  Substance Use Topics  . Alcohol use: No  . Drug use: No     Allergies   Patient has no known allergies.   Review of Systems Review of Systems  Musculoskeletal: Positive for back pain.  Neurological: Positive for weakness.  All other systems reviewed and are negative.    Physical Exam Updated Vital Signs BP (!) 100/57   Pulse 70   Resp 12   Ht 5\' 11"  (1.803 m)   Wt 91.2 kg   SpO2 95%   BMI 28.03 kg/m   Physical Exam Vitals signs and nursing note reviewed.  Constitutional:      Appearance: Normal appearance.  HENT:     Head: Normocephalic.     Comments: Left facial bruising    Right Ear: External ear normal.     Left Ear: External ear normal.     Nose: Nose normal.     Mouth/Throat:     Mouth: Mucous membranes are moist.    Eyes:     Extraocular Movements: Extraocular movements intact.     Conjunctiva/sclera: Conjunctivae normal.  Pupils: Pupils are equal, round, and reactive to light.  Neck:     Musculoskeletal: Normal range of motion.  Cardiovascular:     Rate and Rhythm: Regular rhythm. Tachycardia present.     Pulses: Normal pulses.     Heart sounds: Normal heart sounds.  Pulmonary:     Effort: Pulmonary effort is normal.     Breath sounds: Normal breath sounds.  Abdominal:     General: Abdomen is flat.  Musculoskeletal:     Right lower leg: Edema present.     Left lower leg: Edema present.     Comments: Bruising to left upper arm and to buttocks (L>R)  Skin:    General: Skin is warm and dry.     Capillary Refill: Capillary refill takes less than 2 seconds.  Neurological:     General: No focal deficit present.     Mental Status: He is alert and oriented to person, place, and time.  Psychiatric:        Mood and Affect: Mood normal.      ED Treatments / Results  Labs (all labs ordered are listed, but only abnormal results are displayed) Labs Reviewed  CBC WITH DIFFERENTIAL/PLATELET - Abnormal; Notable for the following components:      Result Value   Hemoglobin 12.4 (*)    RDW 20.3 (*)    Platelets 124 (*)    All other components within normal limits  COMPREHENSIVE METABOLIC PANEL - Abnormal; Notable for the following components:   Chloride 92 (*)    Glucose, Bld 135 (*)    BUN 31 (*)    Creatinine, Ser 1.54 (*)    Calcium 8.8 (*)    Total Protein 6.2 (*)    Albumin 3.4 (*)    Alkaline Phosphatase 167 (*)    Total Bilirubin 1.9 (*)    GFR calc non Af Amer 41 (*)    GFR calc Af Amer 48 (*)    Anion gap 16 (*)    All other components within normal limits  PROTIME-INR - Abnormal; Notable for the following components:   Prothrombin Time 15.4 (*)    All other components within normal limits  URINALYSIS, ROUTINE W REFLEX MICROSCOPIC  CBG MONITORING, ED    EKG EKG  Interpretation  Date/Time:  Wednesday January 07 2019 11:58:15 EST Ventricular Rate:  100 PR Interval:    QRS Duration: 174 QT Interval:  424 QTC Calculation: 547 R Axis:   -56 Text Interpretation:  Sinus tachycardia Borderline prolonged PR interval Right bundle branch block Since last tracing rate faster Confirmed by Isla Pence [id number]) on 01/07/2019 12:27:28 PM   Radiology Dg Chest 2 View  Result Date: 01/07/2019 CLINICAL DATA:  Fall EXAM: CHEST - 2 VIEW COMPARISON:  07/01/2018 chest radiograph. FINDINGS: Partially visualized surgical hardware from ACDF overlying the lower cervical spine. Stable cardiomediastinal silhouette with mild cardiomegaly. No pneumothorax. No pleural effusion. Lungs appear clear, with no acute consolidative airspace disease and no pulmonary edema. No displaced fractures in the visualized chest. IMPRESSION: Mild cardiomegaly. No pulmonary edema. No active pulmonary disease. Electronically Signed   By: Ilona Sorrel M.D.   On: 01/07/2019 13:31   Dg Lumbar Spine Complete  Result Date: 01/07/2019 CLINICAL DATA:  Low back pain after multiple falls. EXAM: LUMBAR SPINE - COMPLETE 4+ VIEW COMPARISON:  CT scan of December 14, 2017. FINDINGS: No fracture or spondylolisthesis is noted. Moderate degenerative disc disease is noted at at L2-3, L3-4, L4-5 and L5-S1. Mild degenerative disc  disease is noted at L1-2. IMPRESSION: Multilevel degenerative disc disease. No acute abnormality seen in the lumbar spine. Electronically Signed   By: Marijo Conception, M.D.   On: 01/07/2019 13:39   Ct Head Wo Contrast  Result Date: 01/07/2019 CLINICAL DATA:  Recurrent falls over the past year in an anticoagulated patient. Weakness and legs giving way. EXAM: CT HEAD WITHOUT CONTRAST TECHNIQUE: Contiguous axial images were obtained from the base of the skull through the vertex without intravenous contrast. COMPARISON:  Head CT scan 08/29/2018 and 12/13/2017. FINDINGS: Brain: No evidence of acute  infarction, hemorrhage, hydrocephalus, extra-axial collection or mass lesion/mass effect. Atrophy and chronic microvascular ischemic change noted. Vascular: No hyperdense vessel or unexpected calcification. Skull: Intact.  No focal lesion. Sinuses/Orbits: Status post cataract surgery.  Otherwise negative. Other: None. IMPRESSION: No acute abnormality. Atrophy and chronic microvascular ischemic change. Electronically Signed   By: Inge Rise M.D.   On: 01/07/2019 13:16    Procedures Procedures (including critical care time)  Medications Ordered in ED Medications  sodium chloride 0.9 % bolus 1,000 mL (1,000 mLs Intravenous New Bag/Given 01/07/19 1554)  sodium chloride 0.9 % bolus 500 mL (0 mLs Intravenous Stopped 01/07/19 1414)     Initial Impression / Assessment and Plan / ED Course  I have reviewed the triage vital signs and the nursing notes.  Pertinent labs & imaging results that were available during my care of the patient were reviewed by me and considered in my medical decision making (see chart for details).    We got patient up to ambulate and he is unable to stand by himself.  Nurses had to support him on both sides.  He is very unsteady.  I don't think he's a candidate for eliquis any more.  BP on the low side, so he was given IVFs.  Pt d/w Dr. Cathlean Sauer ([facility]) for admission.  Final Clinical Impressions(s) / ED Diagnoses   Final diagnoses:  Ambulatory dysfunction  Dehydration  Multiple contusions    ED Discharge Orders    None       Isla Pence, MD 01/07/19 1556

## 2019-01-07 NOTE — ED Notes (Signed)
Attempted report x1. 

## 2019-01-08 DIAGNOSIS — M1990 Unspecified osteoarthritis, unspecified site: Secondary | ICD-10-CM | POA: Diagnosis present

## 2019-01-08 DIAGNOSIS — R269 Unspecified abnormalities of gait and mobility: Secondary | ICD-10-CM | POA: Diagnosis present

## 2019-01-08 DIAGNOSIS — Z85828 Personal history of other malignant neoplasm of skin: Secondary | ICD-10-CM | POA: Diagnosis not present

## 2019-01-08 DIAGNOSIS — K219 Gastro-esophageal reflux disease without esophagitis: Secondary | ICD-10-CM | POA: Diagnosis present

## 2019-01-08 DIAGNOSIS — F329 Major depressive disorder, single episode, unspecified: Secondary | ICD-10-CM | POA: Diagnosis present

## 2019-01-08 DIAGNOSIS — W19XXXA Unspecified fall, initial encounter: Secondary | ICD-10-CM | POA: Diagnosis present

## 2019-01-08 DIAGNOSIS — R262 Difficulty in walking, not elsewhere classified: Secondary | ICD-10-CM | POA: Diagnosis not present

## 2019-01-08 DIAGNOSIS — E860 Dehydration: Secondary | ICD-10-CM | POA: Diagnosis present

## 2019-01-08 DIAGNOSIS — Z87891 Personal history of nicotine dependence: Secondary | ICD-10-CM | POA: Diagnosis not present

## 2019-01-08 DIAGNOSIS — I252 Old myocardial infarction: Secondary | ICD-10-CM | POA: Diagnosis not present

## 2019-01-08 DIAGNOSIS — I130 Hypertensive heart and chronic kidney disease with heart failure and stage 1 through stage 4 chronic kidney disease, or unspecified chronic kidney disease: Secondary | ICD-10-CM | POA: Diagnosis present

## 2019-01-08 DIAGNOSIS — R296 Repeated falls: Secondary | ICD-10-CM | POA: Diagnosis present

## 2019-01-08 DIAGNOSIS — E876 Hypokalemia: Secondary | ICD-10-CM | POA: Diagnosis present

## 2019-01-08 DIAGNOSIS — E1142 Type 2 diabetes mellitus with diabetic polyneuropathy: Secondary | ICD-10-CM | POA: Diagnosis present

## 2019-01-08 DIAGNOSIS — Z96652 Presence of left artificial knee joint: Secondary | ICD-10-CM | POA: Diagnosis present

## 2019-01-08 DIAGNOSIS — Z79899 Other long term (current) drug therapy: Secondary | ICD-10-CM | POA: Diagnosis not present

## 2019-01-08 DIAGNOSIS — Z7984 Long term (current) use of oral hypoglycemic drugs: Secondary | ICD-10-CM | POA: Diagnosis not present

## 2019-01-08 DIAGNOSIS — I482 Chronic atrial fibrillation, unspecified: Secondary | ICD-10-CM | POA: Diagnosis present

## 2019-01-08 DIAGNOSIS — E785 Hyperlipidemia, unspecified: Secondary | ICD-10-CM | POA: Diagnosis present

## 2019-01-08 DIAGNOSIS — R42 Dizziness and giddiness: Secondary | ICD-10-CM | POA: Diagnosis present

## 2019-01-08 DIAGNOSIS — I5032 Chronic diastolic (congestive) heart failure: Secondary | ICD-10-CM | POA: Diagnosis present

## 2019-01-08 DIAGNOSIS — E1122 Type 2 diabetes mellitus with diabetic chronic kidney disease: Secondary | ICD-10-CM | POA: Diagnosis present

## 2019-01-08 DIAGNOSIS — T502X5A Adverse effect of carbonic-anhydrase inhibitors, benzothiadiazides and other diuretics, initial encounter: Secondary | ICD-10-CM | POA: Diagnosis present

## 2019-01-08 DIAGNOSIS — F0390 Unspecified dementia without behavioral disturbance: Secondary | ICD-10-CM | POA: Diagnosis present

## 2019-01-08 DIAGNOSIS — Z7901 Long term (current) use of anticoagulants: Secondary | ICD-10-CM | POA: Diagnosis not present

## 2019-01-08 DIAGNOSIS — N183 Chronic kidney disease, stage 3 (moderate): Secondary | ICD-10-CM | POA: Diagnosis present

## 2019-01-08 LAB — BASIC METABOLIC PANEL
Anion gap: 12 (ref 5–15)
BUN: 22 mg/dL (ref 8–23)
CO2: 28 mmol/L (ref 22–32)
Calcium: 8.4 mg/dL — ABNORMAL LOW (ref 8.9–10.3)
Chloride: 98 mmol/L (ref 98–111)
Creatinine, Ser: 1.52 mg/dL — ABNORMAL HIGH (ref 0.61–1.24)
GFR calc Af Amer: 49 mL/min — ABNORMAL LOW (ref >60)
GFR calc non Af Amer: 42 mL/min — ABNORMAL LOW (ref >60)
Glucose, Bld: 180 mg/dL — ABNORMAL HIGH (ref 70–99)
Potassium: 3.1 mmol/L — ABNORMAL LOW (ref 3.5–5.1)
Sodium: 138 mmol/L (ref 135–145)

## 2019-01-08 MED ORDER — INSULIN ASPART 100 UNIT/ML ~~LOC~~ SOLN
0.0000 [IU] | Freq: Three times a day (TID) | SUBCUTANEOUS | Status: DC
  Administered 2019-01-09: 1 [IU] via SUBCUTANEOUS

## 2019-01-08 MED ORDER — POTASSIUM CHLORIDE CRYS ER 20 MEQ PO TBCR
40.0000 meq | EXTENDED_RELEASE_TABLET | ORAL | Status: AC
  Administered 2019-01-08 (×2): 40 meq via ORAL
  Filled 2019-01-08 (×2): qty 2

## 2019-01-08 MED ORDER — MEMANTINE HCL ER 28 MG PO CP24
28.0000 mg | ORAL_CAPSULE | Freq: Every day | ORAL | Status: DC
  Administered 2019-01-08 – 2019-01-09 (×2): 28 mg via ORAL
  Filled 2019-01-08 (×2): qty 1

## 2019-01-08 NOTE — Evaluation (Signed)
Physical Therapy Evaluation Patient Details Name: [name] MRN: [id number] DOB: 1934/04/05 Today's Date: 01/08/2019   History of Present Illness  ARMAND PREAST is a 85 y.o. male with medical history significant of  hypertension, diabetes mellitus type 2, dyslipidemia, obesity, peripheral neuropathy, diastolic heart failure and atrial fibrillation.  Patient presents with 7-day course of frequent falls, he was brought by his daughter due to persistent symptoms.      Clinical Impression  Pt admitted with above diagnosis. Pt currently with functional limitations due to the deficits listed below (see PT Problem List). PTA, pt living at home with son who is avail most of the time, Today patient no longer dizzy with standing, ambulating short distances with poor mechanics and poor balance. Presents as a fall risk, would benefit from sub acute rehab but patient declining, HHPT next best option. Pt will benefit from skilled PT to increase their independence and safety with mobility to allow discharge to the venue listed below.    BP Supine 135/107 (112) Sit        124/71  (89) Stand   121/66 (84) 3 min   131/72 (90)      Follow Up Recommendations SNF;Home health PT(Pt declining, HHPT with 24 supervision from son next best)    Equipment Recommendations  None recommended by PT    Recommendations for Other Services       Precautions / Restrictions Precautions Precautions: Fall Precaution Comments: Watch BP Restrictions Weight Bearing Restrictions: No      Mobility  Bed Mobility Overal bed mobility: Modified Independent             General bed mobility comments: increased time and effort to sit up  Transfers Overall transfer level: Needs assistance Equipment used: Rolling walker (2 wheeled) Transfers: Sit to/from Stand Sit to Stand: Min guard         General transfer comment: increased time and effort to stand, denies dizziness    Ambulation/Gait Ambulation/Gait assistance: Min guard Gait Distance (Feet): 20 Feet Assistive device: Rolling walker (2 wheeled) Gait Pattern/deviations: Step-to pattern Gait velocity: decreased   General Gait Details: patient with severe flexed posture during gait and poor use of RW. presents as a fall risk discussed posture, BOS as it relates to balance and postural stability, patient unable to quickly adapt cues.   Stairs            Wheelchair Mobility    Modified Rankin (Stroke Patients Only)       Balance Overall balance assessment: Needs assistance   Sitting balance-Leahy Scale: Fair       Standing balance-Leahy Scale: Poor                               Pertinent Vitals/Pain Pain Assessment: Faces Faces Pain Scale: Hurts little more Pain Location: low back R hip from fall Pain Descriptors / Indicators: Aching Pain Intervention(s): Limited activity within patient's tolerance;Monitored during session    Home Living Family/patient expects to be discharged to:: Private residence Living Arrangements: Children Available Help at Discharge: Family;Available 24 hours/day Type of Home: House Home Access: Stairs to enter Entrance Stairs-Rails: None Entrance Stairs-Number of Steps: 2 Home Layout: One level Home Equipment: Walker - 2 wheels;Cane - single point;Wheelchair - manual;Grab bars - tub/shower;Grab bars - toilet      Prior Function Level of Independence: Independent with assistive device(s)         Comments: I with  ADls, RW to ambulate      Hand Dominance        Extremity/Trunk Assessment   Upper Extremity Assessment Upper Extremity Assessment: Overall WFL for tasks assessed    Lower Extremity Assessment Lower Extremity Assessment: Overall WFL for tasks assessed    Cervical / Trunk Assessment Cervical / Trunk Assessment: Normal  Communication      Cognition Arousal/Alertness: Awake/alert Behavior During Therapy: WFL  for tasks assessed/performed Overall Cognitive Status: Within Functional Limits for tasks assessed                                        General Comments      Exercises     Assessment/Plan    PT Assessment Patient needs continued PT services  PT Problem List Decreased strength       PT Treatment Interventions DME instruction;Gait training;Stair training;Functional mobility training;Therapeutic activities;Therapeutic exercise;Balance training    PT Goals (Current goals can be found in the Care Plan section)  Acute Rehab PT Goals Patient Stated Goal: go home PT Goal Formulation: With patient Time For Goal Achievement: 01/22/19 Potential to Achieve Goals: Fair    Frequency Min 3X/week   Barriers to discharge Decreased caregiver support Son home most of the time    Co-evaluation               AM-PAC PT "6 Clicks" Mobility  Outcome Measure Help needed turning from your back to your side while in a flat bed without using bedrails?: A Little Help needed moving from lying on your back to sitting on the side of a flat bed without using bedrails?: A Little Help needed moving to and from a bed to a chair (including a wheelchair)?: A Little Help needed standing up from a chair using your arms (e.g., wheelchair or bedside chair)?: A Lot Help needed to walk in hospital room?: A Lot Help needed climbing 3-5 steps with a railing? : A Lot 6 Click Score: 15    End of Session Equipment Utilized During Treatment: Gait belt Activity Tolerance: Patient tolerated treatment well Patient left: in bed;with call bell/phone within reach;with bed alarm set;with family/visitor present Nurse Communication: Mobility status PT Visit Diagnosis: Unsteadiness on feet (R26.81)    Time: 9211-9417 PT Time Calculation (min) (ACUTE ONLY): 24 min   Charges:   PT Evaluation $PT Eval Low Complexity: 1 Low PT Treatments $Gait Training: 8-22 mins        Reinaldo Berber, PT,  DPT Acute Rehabilitation Services Pager: [phone] Office: [place] 01/08/2019, 1:08 PM

## 2019-01-08 NOTE — Progress Notes (Signed)
PROGRESS NOTE    [name]  GEZ:[id number] DOB: 07-31-1934 DOA: 01/07/2019 PCP: Imagene Riches, NP     Brief Narrative:  [name] is a  85 y.o. male with medical history significant of hypertension, diabetes mellitus type 2, dyslipidemia, obesity, peripheral neuropathy, diastolic heart failure and atrial fibrillation.  Patient presents with 7-day course of frequent falls, he was brought by his daughter due to persistent symptoms.  He describes orthostatic symptoms, feeling dizzy and lightheaded after standing, from a sitting position, then triggering frequent falls.  He has had head trauma but no loss of consciousness.  Denies any angina, PND, orthopnea lower extremity edema.  For the last 6 months his appetite has decreased significantly and he has lost about 8 pounds.  He is on a aggressive diuretic regimen at home. Patient will be admitted to the hospital working diagnosis of dehydration complicated by ambulatory dysfunction.  New events last 24 hours / Subjective: No acute events overnight  Assessment & Plan:   Active Problems:   Ambulatory dysfunction   Frequent falls, likely secondary to overdiuresis and dehydration -Received IV fluid overnight -Orthostatic vital signs negative today -PT recommending skilled nursing facility placement  Chronic diastolic heart failure -No evidence of acute exacerbation.  Holding diuretic  Chronic atrial fibrillation -Continue rate control with Toprol and amiodarone, continue anticoagulation with apixaban  Hyperlipidemia -Continue Crestor  Chronic kidney disease stage III -At baseline creatinine  Dementia, depression -Continue Namenda and escitalopram  Type 2 diabetes mellitus -Sliding scale insulin  Hypokalemia -Replace, trend   DVT prophylaxis: Eliquis Code Status: Full Family Communication: Spoke with daughter over the phone Disposition Plan: SNF recommended, patient declined initially. He does have hx  dementia. Spoke with daughter who will work on convincing patient to discharge to SNF instead of home.    Consultants:   None  Procedures:   None   Antimicrobials:  Anti-infectives (From admission, onward)   None        Objective: Vitals:   01/07/19 1805 01/07/19 2142 01/07/19 2333 01/08/19 0746  BP: 130/62 (!) 116/54 119/70 (!) 138/58  Pulse: (!) 58 62 65 61  Resp:   20 18  Temp: 97.7 F (36.5 C)  98 F (36.7 C) 98.5 F (36.9 C)  TempSrc: Axillary  Oral   SpO2: 96%  93% 96%  Weight:      Height:        Intake/Output Summary (Last 24 hours) at 01/08/2019 1349 Last data filed at 01/07/2019 2300 Gross per 24 hour  Intake 1465 ml  Output -  Net 1465 ml   Filed Weights   01/07/19 1157  Weight: 91.2 kg    Examination:  General exam: Appears calm and comfortable  Respiratory system: Clear to auscultation. Respiratory effort normal. Cardiovascular system: S1 & S2 heard. No JVD, murmurs, rubs, gallops or clicks. No pedal edema. Gastrointestinal system: Abdomen is nondistended, soft and nontender. No organomegaly or masses felt. Normal bowel sounds heard. Central nervous system: Alert and oriented. No focal neurological deficits. Extremities: Symmetric 5 x 5 power. Skin: No rashes, lesions or ulcers Psychiatry: Judgement and insight appear normal. Mood & affect appropriate.   Data Reviewed: I have personally reviewed following labs and imaging studies  CBC: Recent Labs  Lab 01/07/19 1159  WBC 5.7  NEUTROABS 4.2  HGB 12.4*  HCT 39.8  MCV 84.1  PLT 650*   Basic Metabolic Panel: Recent Labs  Lab 01/07/19 1159 01/08/19 0738  NA 136 138  K 3.6  3.1*  CL 92* 98  CO2 28 28  GLUCOSE 135* 180*  BUN 31* 22  CREATININE 1.54* 1.52*  CALCIUM 8.8* 8.4*   GFR: Estimated Creatinine Clearance: 43.3 mL/min (A) (by C-G formula based on SCr of 1.52 mg/dL (H)). Liver Function Tests: Recent Labs  Lab 01/07/19 1159  AST 36  ALT 26  ALKPHOS 167*  BILITOT 1.9*    PROT 6.2*  ALBUMIN 3.4*   No results for input(s): LIPASE, AMYLASE in the last 168 hours. No results for input(s): AMMONIA in the last 168 hours. Coagulation Profile: Recent Labs  Lab 01/07/19 1159  INR 1.24   Cardiac Enzymes: No results for input(s): CKTOTAL, CKMB, CKMBINDEX, TROPONINI in the last 168 hours. BNP (last 3 results) No results for input(s): PROBNP in the last 8760 hours. HbA1C: No results for input(s): HGBA1C in the last 72 hours. CBG: No results for input(s): GLUCAP in the last 168 hours. Lipid Profile: No results for input(s): CHOL, HDL, LDLCALC, TRIG, CHOLHDL, LDLDIRECT in the last 72 hours. Thyroid Function Tests: No results for input(s): TSH, T4TOTAL, FREET4, T3FREE, THYROIDAB in the last 72 hours. Anemia Panel: No results for input(s): VITAMINB12, FOLATE, FERRITIN, TIBC, IRON, RETICCTPCT in the last 72 hours. Sepsis Labs: No results for input(s): PROCALCITON, LATICACIDVEN in the last 168 hours.  No results found for this or any previous visit (from the past 240 hour(s)).     Radiology Studies: Dg Chest 2 View  Result Date: 01/07/2019 CLINICAL DATA:  Fall EXAM: CHEST - 2 VIEW COMPARISON:  07/01/2018 chest radiograph. FINDINGS: Partially visualized surgical hardware from ACDF overlying the lower cervical spine. Stable cardiomediastinal silhouette with mild cardiomegaly. No pneumothorax. No pleural effusion. Lungs appear clear, with no acute consolidative airspace disease and no pulmonary edema. No displaced fractures in the visualized chest. IMPRESSION: Mild cardiomegaly. No pulmonary edema. No active pulmonary disease. Electronically Signed   By: Ilona Sorrel M.D.   On: 01/07/2019 13:31   Dg Lumbar Spine Complete  Result Date: 01/07/2019 CLINICAL DATA:  Low back pain after multiple falls. EXAM: LUMBAR SPINE - COMPLETE 4+ VIEW COMPARISON:  CT scan of December 14, 2017. FINDINGS: No fracture or spondylolisthesis is noted. Moderate degenerative disc disease is  noted at at L2-3, L3-4, L4-5 and L5-S1. Mild degenerative disc disease is noted at L1-2. IMPRESSION: Multilevel degenerative disc disease. No acute abnormality seen in the lumbar spine. Electronically Signed   By: Marijo Conception, M.D.   On: 01/07/2019 13:39   Ct Head Wo Contrast  Result Date: 01/07/2019 CLINICAL DATA:  Recurrent falls over the past year in an anticoagulated patient. Weakness and legs giving way. EXAM: CT HEAD WITHOUT CONTRAST TECHNIQUE: Contiguous axial images were obtained from the base of the skull through the vertex without intravenous contrast. COMPARISON:  Head CT scan 08/29/2018 and 12/13/2017. FINDINGS: Brain: No evidence of acute infarction, hemorrhage, hydrocephalus, extra-axial collection or mass lesion/mass effect. Atrophy and chronic microvascular ischemic change noted. Vascular: No hyperdense vessel or unexpected calcification. Skull: Intact.  No focal lesion. Sinuses/Orbits: Status post cataract surgery.  Otherwise negative. Other: None. IMPRESSION: No acute abnormality. Atrophy and chronic microvascular ischemic change. Electronically Signed   By: Inge Rise M.D.   On: 01/07/2019 13:16      Scheduled Meds: . allopurinol  300 mg Oral Daily  . amiodarone  200 mg Oral Daily  . apixaban  2.5 mg Oral BID  . escitalopram  5 mg Oral Daily  . Gabapentin Enacarbil  1 tablet Oral QHS  .  magnesium oxide  400 mg Oral Daily  . memantine  28 mg Oral Daily  . metoprolol succinate  100 mg Oral Daily  . pantoprazole  40 mg Oral Daily  . rosuvastatin  5 mg Oral Daily   Continuous Infusions:   LOS: 0 days    Time spent: 35 minutes   Dessa Phi, DO [facility] [email or web address] 01/08/2019, 1:49 PM

## 2019-01-08 NOTE — Care Management Note (Addendum)
Case Management Note  Patient Details  Name: TAKAHIRO GODINHO MRN: [id number] Date of Birth: 16-Feb-1934  Subjective/Objective: From home, for dc tomorrow, will need HHPT, HHOT, Aide and Social Work, [employer] offered choice.  Patient chose [facility], referral left on vm for Butch Penny with [facility],  Awaiting call back  To see if can take referral.  2/14 Tomi Bamberger RN, BSN - Per Butch Penny with [facility] , they can take referral but will not be able to do the aide . NCM will let patient know to make sure that is ok with him.  Patient was ok with this.  He will be followed by [facility] for [facility] services.                    Action/Plan: DC home when ready.  Expected Discharge Date:                  Expected Discharge Plan:  [place]  In-House Referral:     Discharge planning Services  CM Consult  Post Acute Care Choice:  Home Health Choice offered to:  Patient  DME Arranged:    DME Agency:     HH Arranged:  PT, OT, RN, Disease Management, aide, social work [employer] Agency:  [place]  Status of Service:  Completed, signed off  If discussed at [employer], dates discussed:    Additional Comments:  Zenon Mayo, RN 01/08/2019, 4:56 PM

## 2019-01-09 ENCOUNTER — Telehealth: Payer: Self-pay | Admitting: Cardiovascular Disease

## 2019-01-09 LAB — BASIC METABOLIC PANEL
Anion gap: 7 (ref 5–15)
BUN: 19 mg/dL (ref 8–23)
CO2: 26 mmol/L (ref 22–32)
Calcium: 8.5 mg/dL — ABNORMAL LOW (ref 8.9–10.3)
Chloride: 103 mmol/L (ref 98–111)
Creatinine, Ser: 1.39 mg/dL — ABNORMAL HIGH (ref 0.61–1.24)
GFR calc Af Amer: 54 mL/min — ABNORMAL LOW (ref >60)
GFR calc non Af Amer: 47 mL/min — ABNORMAL LOW (ref >60)
GLUCOSE: 152 mg/dL — AB (ref 70–99)
POTASSIUM: 3.8 mmol/L (ref 3.5–5.1)
Sodium: 136 mmol/L (ref 135–145)

## 2019-01-09 LAB — GLUCOSE, CAPILLARY
Glucose-Capillary: 129 mg/dL — ABNORMAL HIGH (ref 70–99)
Glucose-Capillary: 135 mg/dL — ABNORMAL HIGH (ref 70–99)

## 2019-01-09 LAB — MAGNESIUM: Magnesium: 2.2 mg/dL (ref 1.7–2.4)

## 2019-01-09 MED ORDER — FUROSEMIDE 40 MG PO TABS: 40.0000 mg | ORAL_TABLET | Freq: Every day | ORAL | 0 refills | Status: DC

## 2019-01-09 MED ORDER — POTASSIUM CHLORIDE CRYS ER 20 MEQ PO TBCR: 20.0000 meq | EXTENDED_RELEASE_TABLET | Freq: Every day | ORAL | 0 refills | Status: DC

## 2019-01-09 NOTE — Progress Notes (Signed)
Occupational Therapy Evaluation Patient Details Name: [name] MRN: [id number] DOB: 07-04-1934 Today's Date: 01/09/2019    History of Present Illness KOUA DEEG is a 85 y.o. male with medical history significant of  hypertension, diabetes mellitus type 2, dyslipidemia, obesity, peripheral neuropathy, diastolic heart failure and atrial fibrillation.  Patient presents with 7-day course of frequent falls, he was brought by his daughter due to persistent symptoms.     Clinical Impression   PTA, pt was living at home with his son, and reports he was independent with ADLs and functional mobility. Pt reports 8 falls in the previous month which occurred while he was walking and suddenly felt weak and dizzy. Pt currently requires minA-modA for ADL and minA for functional mobility at RW level. Pt denied feeling dizzy/lightheaded throughout session. Due to decline in current level of function, pt would benefit from acute OT to address establish goals to facilitate safe D/C home. At this time, recommend SNF follow-up. If pt declines, he will need HHOT and 24/7 physical assist.      Follow Up Recommendations  SNF;Supervision/Assistance - 24 hour(if pt declines he will need HHOT and 24/7 physical assist )    Equipment Recommendations  3 in 1 bedside commode;Other (comment)(adaptive equipment)    Recommendations for Other Services       Precautions / Restrictions Precautions Precautions: Fall Precaution Comments: Watch BP Restrictions Weight Bearing Restrictions: No      Mobility Bed Mobility Overal bed mobility: Modified Independent             General bed mobility comments: increased time and effort to sit up;pt stated he felt that was the best he's sat up since hes been here  Transfers Overall transfer level: Needs assistance Equipment used: Rolling walker (2 wheeled) Transfers: Sit to/from Stand Sit to Stand: Min assist         General transfer comment: increased  time and effort to stand, denies dizziness;minA for powerup and stability in standing without external support to don LB dressing    Balance Overall balance assessment: Needs assistance Sitting-balance support: Single extremity supported Sitting balance-Leahy Scale: Fair Sitting balance - Comments: single UE support to lean to side to don briefs/pants   Standing balance support: No upper extremity supported;During functional activity Standing balance-Leahy Scale: Poor Standing balance comment: minA to maintain stability without external support                           ADL either performed or assessed with clinical judgement   ADL Overall ADL's : Needs assistance/impaired Eating/Feeding: Set up;Sitting   Grooming: Minimal assistance;Standing   Upper Body Bathing: Minimal assistance;Sitting   Lower Body Bathing: Moderate assistance;Sit to/from stand Lower Body Bathing Details (indicate cue type and reason): modA to complete LB bathing Upper Body Dressing : Minimal assistance;Sitting Upper Body Dressing Details (indicate cue type and reason): pt donned shirt with minA to guide shirt over head Lower Body Dressing: Moderate assistance;Sit to/from stand Lower Body Dressing Details (indicate cue type and reason): modA to don socks/shoes;pt able to don pants and briefs with increased effort  Toilet Transfer: Minimal assistance;Ambulation;RW   Toileting- Clothing Manipulation and Hygiene: Minimal assistance;Sit to/from stand Toileting - Clothing Manipulation Details (indicate cue type and reason): minA for stability in standing     Functional mobility during ADLs: Minimal assistance;Rolling walker General ADL Comments: pt demonstrated decreased activity tolerance during UB/LB dressing with increased WOB DoE 3/4; no reports of  dizziness or lightheaded during ADL     Vision         Perception     Praxis      Pertinent Vitals/Pain Pain Assessment: No/denies pain      Hand Dominance Right   Extremity/Trunk Assessment Upper Extremity Assessment Upper Extremity Assessment: Generalized weakness   Lower Extremity Assessment Lower Extremity Assessment: Defer to PT evaluation   Cervical / Trunk Assessment Cervical / Trunk Assessment: Kyphotic   Communication     Cognition Arousal/Alertness: Awake/alert Behavior During Therapy: WFL for tasks assessed/performed Overall Cognitive Status: Impaired/Different from baseline Area of Impairment: Safety/judgement                         Safety/Judgement: Decreased awareness of safety;Decreased awareness of deficits     General Comments: required consistent vc for safe handplacement;pt reports he feels safe enough to return home, pt with decreased understanding of deficits   General Comments  pt asymptomatic throughout session;educated pt on importance of sub-acute rehab prior to return home to maximize independence and safety, pt stated he is unsure of what to do at the moment and will continue to think about it    Exercises     Shoulder Instructions      Home Living Family/patient expects to be discharged to:: Private residence Living Arrangements: Children Available Help at Discharge: Family;Available 24 hours/day Type of Home: House Home Access: Stairs to enter [employer] of Steps: 2 Entrance Stairs-Rails: None Home Layout: One level(has basement pt does not need to go down to basement)     Bathroom Shower/Tub: Teacher, early years/pre: Standard     Home Equipment: Environmental consultant - 2 wheels;Cane - single point;Wheelchair - manual;Grab bars - tub/shower;Grab bars - toilet          Prior Functioning/Environment Level of Independence: Independent with assistive device(s)        Comments: I with ADls, RW to ambulate; reports 8 falls in the past month        OT Problem List: Decreased activity tolerance;Impaired balance (sitting and/or standing);Decreased  safety awareness;Decreased knowledge of use of DME or AE      OT Treatment/Interventions: Self-care/ADL training;Therapeutic exercise;Energy conservation;DME and/or AE instruction;Therapeutic activities;Patient/family education;Balance training    OT Goals(Current goals can be found in the care plan section) Acute Rehab OT Goals Patient Stated Goal: to get stronger OT Goal Formulation: With patient Time For Goal Achievement: 01/23/19 Potential to Achieve Goals: Good  OT Frequency: Min 2X/week   Barriers to D/C:            Co-evaluation              AM-PAC OT "6 Clicks" Daily Activity     Outcome Measure Help from another person eating meals?: None Help from another person taking care of personal grooming?: A Little Help from another person toileting, which includes using toliet, bedpan, or urinal?: A Little Help from another person bathing (including washing, rinsing, drying)?: A Little Help from another person to put on and taking off regular upper body clothing?: A Little Help from another person to put on and taking off regular lower body clothing?: A Little 6 Click Score: 19   End of Session Equipment Utilized During Treatment: Gait belt;Rolling walker Nurse Communication: Mobility status  Activity Tolerance: Patient tolerated treatment well Patient left: in bed;with call bell/phone within reach;with bed alarm set  OT Visit Diagnosis: Unsteadiness on feet (R26.81);Other abnormalities of gait and  mobility (R26.89);Repeated falls (R29.6);Muscle weakness (generalized) (M62.81);History of falling (Z91.81)                Time: 8343-7357 OT Time Calculation (min): 27 min Charges:  OT General Charges $OT Visit: 1 Visit OT Evaluation $OT Eval Moderate Complexity: 1 Mod OT Treatments $Self Care/Home Management : 8-22 mins  Dorinda Hill OTR/L Acute Rehabilitation Services Office: [place] 01/09/2019, 10:57 AM

## 2019-01-09 NOTE — Discharge Summary (Signed)
Physician Discharge Summary  [name] YYT:[id number] DOB: [birth year] DOA: 01/07/2019  PCP: Imagene Riches, NP  Admit date: 01/07/2019 Discharge date: 01/09/2019  Admitted From: Home Disposition:  Home, declined SNF, discussed with daughter   Recommendations for Outpatient Follow-up:  1. Follow up with PCP in 1 week  Home Health: PT OT RN SW    Discharge Condition: Stable CODE STATUS: Full  Diet recommendation: Heart healthy   Brief/Interim Summary: [name] is a 85 y.o.malewith medical history significant ofhypertension, diabetes mellitus type 2, dyslipidemia, obesity,peripheral neuropathy,diastolic heart failure and atrial fibrillation. Patient presents with 7-day course of frequent falls, he was brought by his daughter due to persistent symptoms.He describes orthostatic symptoms, feeling dizzy and lightheaded after standing, from asitting position, then triggeringfrequent falls.He has had head trauma but no loss of consciousness.Denies any angina, PND, orthopnea lower extremity edema. For the last 6 months his appetite has decreased significantly and he has lost about 8 pounds. He is on a aggressive diuretic regimen at home. Patient will be admitted to the hospital working diagnosis of dehydration complicated by ambulatory dysfunction.  Discharge Diagnoses:   Frequent falls, likely secondary to overdiuresis and dehydration -Received IV fluid overnight on admission -Orthostatic vital signs negative  -PT recommending skilled nursing facility placement, patient declined, discussed with daughter. Will set up [facility] services  Chronic diastolic heart failure -No evidence of acute exacerbation.  Held diuretic on admission. Can resume at lower dose.   Chronic atrial fibrillation -Continue rate control with Toprol and amiodarone, continue anticoagulation with apixaban  Hyperlipidemia -Continue Crestor  Chronic kidney disease stage III -Stable    Dementia, depression -Continue Namenda and escitalopram  Type 2 diabetes mellitus -Sliding scale insulin    Discharge Instructions  Discharge Instructions    (HEART FAILURE PATIENTS) Call MD:  Anytime you have any of the following symptoms: 1) 3 pound weight gain in 24 hours or 5 pounds in 1 week 2) shortness of breath, with or without a dry hacking cough 3) swelling in the hands, feet or stomach 4) if you have to sleep on extra pillows at night in order to breathe.   Complete by:  As directed    Call MD for:  difficulty breathing, headache or visual disturbances   Complete by:  As directed    Call MD for:  extreme fatigue   Complete by:  As directed    Call MD for:  persistant dizziness or light-headedness   Complete by:  As directed    Call MD for:  persistant nausea and vomiting   Complete by:  As directed    Call MD for:  severe uncontrolled pain   Complete by:  As directed    Call MD for:  temperature >100.4   Complete by:  As directed    Diet - low sodium heart healthy   Complete by:  As directed    Discharge instructions   Complete by:  As directed    You were cared for by a hospitalist during your hospital stay. If you have any questions about your discharge medications or the care you received while you were in the hospital after you are discharged, you can call the unit and ask to speak with the hospitalist on call if the hospitalist that took care of you is not available. Once you are discharged, your primary care physician will handle any further medical issues. Please note that NO REFILLS for any discharge medications will be authorized once you are discharged,  as it is imperative that you return to your primary care physician (or establish a relationship with a primary care physician if you do not have one) for your aftercare needs so that they can reassess your need for medications and monitor your lab values.   Increase activity slowly   Complete by:  As directed       Allergies as of 01/09/2019   No Known Allergies     Medication List    TAKE these medications   allopurinol 300 MG tablet Commonly known as:  ZYLOPRIM Take 1 tablet by mouth daily.   amiodarone 200 MG tablet Commonly known as:  PACERONE Take 1 tablet (200 mg total) by mouth daily.   apixaban 2.5 MG Tabs tablet Commonly known as:  ELIQUIS Take 1 tablet (2.5 mg total) by mouth 2 (two) times daily.   escitalopram 10 MG tablet Commonly known as:  LEXAPRO Take 0.5 tablets (5 mg total) by mouth daily. What changed:  how much to take   furosemide 40 MG tablet Commonly known as:  LASIX Take 1 tablet (40 mg total) by mouth daily. What changed:  when to take this   HORIZANT 600 MG Tbcr Generic drug:  Gabapentin Enacarbil Take 600 mg by mouth at bedtime.   ICAPS LUTEIN & ZEAXANTHIN Tbec Take 1 tablet by mouth daily at 12 noon.   ICAPS PO Take 1 tablet by mouth daily at 12 noon.   Magnesium Oxide 400 MG Caps Take 400 mg by mouth daily.   metFORMIN 500 MG 24 hr tablet Commonly known as:  GLUCOPHAGE-XR Take 1,000 mg by mouth 2 (two) times daily.   metolazone 2.5 MG tablet Commonly known as:  ZAROXOLYN Take 1 tablet every 2-3 days What changed:    how much to take  how to take this  when to take this  additional instructions   metoprolol succinate 100 MG 24 hr tablet Commonly known as:  TOPROL-XL TAKE 1 TABLET BY MOUTH DAILY   NAMENDA XR 28 MG Cp24 24 hr capsule Generic drug:  memantine Take 28 mg by mouth daily.   omeprazole 20 MG capsule Commonly known as:  PRILOSEC Take 20 mg by mouth daily.   oxyCODONE 5 MG immediate release tablet Commonly known as:  Oxy IR/ROXICODONE Take 1-2 tablets (5-10 mg total) by mouth every 4 (four) hours as needed. What changed:  when to take this   potassium chloride SA 20 MEQ tablet Commonly known as:  K-DUR,KLOR-CON Take 1 tablet (20 mEq total) by mouth daily. What changed:  when to take this   rosuvastatin 5 MG  tablet Commonly known as:  CRESTOR Take 5 mg by mouth daily.   silver sulfADIAZINE 1 % cream Commonly known as:  SILVADENE Apply 1 application topically as needed (open sores legs).   testosterone cypionate 200 MG/ML injection Commonly known as:  DEPOTESTOSTERONE CYPIONATE Inject 200 mg into the muscle every 14 (fourteen) days.      Follow-up Information    [facility].   Specialty:  [place] Why:  HHRN,HHPT, HHOT,  [place] work Sport and exercise psychologist information: [address] [place] [address] [phone]        Imagene Riches, NP. Schedule an appointment as soon as possible for a visit in 1 week(s).   Contact information: [place] [address] [phone]          No Known Allergies  Consultations:  None    Procedures/Studies: Dg  Chest 2 View  Result Date: 01/07/2019 CLINICAL DATA:  Fall EXAM: CHEST - 2 VIEW COMPARISON:  07/01/2018 chest radiograph. FINDINGS: Partially visualized surgical hardware from ACDF overlying the lower cervical spine. Stable cardiomediastinal silhouette with mild cardiomegaly. No pneumothorax. No pleural effusion. Lungs appear clear, with no acute consolidative airspace disease and no pulmonary edema. No displaced fractures in the visualized chest. IMPRESSION: Mild cardiomegaly. No pulmonary edema. No active pulmonary disease. Electronically Signed   By: Ilona Sorrel M.D.   On: 01/07/2019 13:31   Dg Lumbar Spine Complete  Result Date: 01/07/2019 CLINICAL DATA:  Low back pain after multiple falls. EXAM: LUMBAR SPINE - COMPLETE 4+ VIEW COMPARISON:  CT scan of December 14, 2017. FINDINGS: No fracture or spondylolisthesis is noted. Moderate degenerative disc disease is noted at at L2-3, L3-4, L4-5 and L5-S1. Mild degenerative disc disease is noted at L1-2. IMPRESSION: Multilevel degenerative disc disease. No acute abnormality seen in the lumbar spine. Electronically Signed   By: Marijo Conception, M.D.   On: 01/07/2019 13:39   Ct Head Wo Contrast  Result Date: 01/07/2019 CLINICAL DATA:  Recurrent falls over the past year in an anticoagulated patient. Weakness and legs giving way. EXAM: CT HEAD WITHOUT CONTRAST TECHNIQUE: Contiguous axial images were obtained from the base of the skull through the vertex without intravenous contrast. COMPARISON:  Head CT scan 08/29/2018 and 12/13/2017. FINDINGS: Ethan: No evidence of acute infarction, hemorrhage, hydrocephalus, extra-axial collection or mass lesion/mass effect. Atrophy and chronic microvascular ischemic change noted. Vascular: No hyperdense vessel or unexpected calcification. Skull: Intact.  No focal lesion. Sinuses/Orbits: Status post cataract surgery.  Otherwise negative. Other: None. IMPRESSION: No acute abnormality. Atrophy and chronic microvascular ischemic change. Electronically Signed   By: Inge Rise M.D.   On: 01/07/2019 13:16       Discharge Exam: Vitals:   01/09/19 0739 01/09/19 0940  BP: 128/79 130/80  Pulse: (!) 107 100  Resp: 15   Temp: 97.8 F (36.6 C)   SpO2: 97%     General: Pt is alert, awake, not in acute distress Cardiovascular: RRR, S1/S2 +, no rubs, no gallops Respiratory: CTA bilaterally, no wheezing, no rhonchi Abdominal: Soft, NT, ND, bowel sounds + Extremities: no edema, no cyanosis    The results of significant diagnostics from this hospitalization (including imaging, microbiology, ancillary and laboratory) are listed below for reference.     Microbiology: No results found for this or any previous visit (from the past 240 hour(s)).   Labs: BNP (last 3 results) No results for input(s): BNP in the last 8760 hours. Basic Metabolic Panel: Recent Labs  Lab 01/07/19 1159 01/08/19 0738 01/09/19 0223  NA 136 138 136  K 3.6 3.1* 3.8  CL 92* 98 103  CO2 28 28 26   GLUCOSE 135* 180* 152*  BUN 31* 22 19  CREATININE 1.54* 1.52* 1.39*  CALCIUM 8.8* 8.4* 8.5*  MG  --   --  2.2   Liver  Function Tests: Recent Labs  Lab 01/07/19 1159  AST 36  ALT 26  ALKPHOS 167*  BILITOT 1.9*  PROT 6.2*  ALBUMIN 3.4*   No results for input(s): LIPASE, AMYLASE in the last 168 hours. No results for input(s): AMMONIA in the last 168 hours. CBC: Recent Labs  Lab 01/07/19 1159  WBC 5.7  NEUTROABS 4.2  HGB 12.4*  HCT 39.8  MCV 84.1  PLT 124*   Cardiac Enzymes: No results for input(s): CKTOTAL, CKMB, CKMBINDEX, TROPONINI in the last 168 hours.  BNP: Invalid input(s): POCBNP CBG: Recent Labs  Lab 01/09/19 0821  GLUCAP 129*   D-Dimer No results for input(s): DDIMER in the last 72 hours. Hgb A1c No results for input(s): HGBA1C in the last 72 hours. Lipid Profile No results for input(s): CHOL, HDL, LDLCALC, TRIG, CHOLHDL, LDLDIRECT in the last 72 hours. Thyroid function studies No results for input(s): TSH, T4TOTAL, T3FREE, THYROIDAB in the last 72 hours.  Invalid input(s): FREET3 Anemia work up No results for input(s): VITAMINB12, FOLATE, FERRITIN, TIBC, IRON, RETICCTPCT in the last 72 hours. Urinalysis    Component Value Date/Time   COLORURINE YELLOW 01/07/2019 1414   APPEARANCEUR CLEAR 01/07/2019 1414   LABSPEC 1.012 01/07/2019 1414   PHURINE 7.0 01/07/2019 1414   GLUCOSEU NEGATIVE 01/07/2019 1414   HGBUR NEGATIVE 01/07/2019 1414   BILIRUBINUR NEGATIVE 01/07/2019 1414   KETONESUR NEGATIVE 01/07/2019 1414   PROTEINUR NEGATIVE 01/07/2019 1414   UROBILINOGEN 0.2 09/01/2012 1026   NITRITE NEGATIVE 01/07/2019 1414   LEUKOCYTESUR NEGATIVE 01/07/2019 1414   Sepsis Labs Invalid input(s): PROCALCITONIN,  WBC,  LACTICIDVEN Microbiology No results found for this or any previous visit (from the past 240 hour(s)).   Patient was seen and examined on the day of discharge and was found to be in stable condition. Time coordinating discharge: 25 minutes including assessment and coordination of care, as well as examination of the patient.   SIGNED:  Dessa Phi,  DO [facility] [email or web address] 01/09/2019, 10:15 AM

## 2019-01-09 NOTE — Telephone Encounter (Signed)
New Message   Pt c/o swelling: STAT is pt has developed SOB within 24 hours  1) How much weight have you gained and in what time span? 8 lbs since Wednesday 02/12   2) If swelling, where is the swelling located? Stomach is bloated and chest, and some swelling in the ankles  3) Are you currently taking a fluid pill? Yes, Lasix and Metolazone  4) Are you currently SOB? Yes, he is wore out   5) Do you have a log of your daily weights (if so, list)? Yes  6) Have you gained 3 pounds in a day or 5 pounds in a week? Yes  7) Have you traveled recently? No

## 2019-01-09 NOTE — Telephone Encounter (Signed)
Triage call received form the operator. Pt daughter [name] called to let Dr.Kelly know that the pt was discharge from [facility] today. The pt was admitted on 01/07/19 for dehydration and several falls.  [name] sts that the pt has gained 8lbs over the last week. He has some swelling in his abdomen and LE extremity. Pt d/c documentation the physician did not feel that the pt was in acute CHF. She would like Dr.Kelly's recommendation regarding the pt diuretic dosing. His lasix and potassium has been recently reduced at d/c and [name] is concerned the the pt will become volume overloaded. It was recommended that the pt go to a SNF but the pt refused and was d/c home. Pt is scheduled to see Dr.Kelly on 01/19/19. [name] that Dr.Kelly is out of the office next wk and 2/24 would be the earliest appt with him.  [name] that I will fwd the update to [facility] to review the pt recent hospitalization and give his recommendation regarding diuretic parameters. Adv her to contact the office sooner if pt weight continues to increase, or pt develops worsening sob or edema. [name] is agreeable with the plan and verbalized understanding.

## 2019-01-11 NOTE — Telephone Encounter (Signed)
I will be a hospital rounder this upcoming week and I am not in the office.  Please arrange the patient to be seen by an APP follow-up of his recent hospitalization and correct diuretic regimen can be decided at that time

## 2019-01-12 NOTE — Telephone Encounter (Signed)
DPR on file. Spoke with the patient's daughter Shirlean Mylar and made her aware of Dr.Kelly's recommendation. Shirlean Mylar agrees with plan. Appt scheduled for 01/14/19 @ 2pm with Jory Sims, DNP. Robin aware of appt, date, time, and location and voices appreciation for the call.

## 2019-01-14 ENCOUNTER — Ambulatory Visit: Payer: Medicare Other | Admitting: Adult Health

## 2019-01-14 NOTE — Progress Notes (Deleted)
Cardiology Office Note   Date:  01/14/2019   ID:  [name], DOB Mar 03, 1934, MRN [id number]  PCP:  Imagene Riches, NP  Cardiologist: Peter Congo chief complaint on file.    History of Present Illness: [name] is a 85 y.o. male who presents for ongoing assessment and management of CAD, HTN, HL, atrial fib on Eliquis,  with other history to include Type II DM, obesity and peripheral neuropathy. He suffers from chronic LEE, and is on lasix and metolazone which he takes every 3rd.day. Last creatinine 1.49. Recent echo revealed an EF of 45%-50%.   On last visit with Dr Claiborne Billings, he complained of LEE and was told to use metolazone as directed but could increase frequency if LEE worsened. He called our office on 01/11/2019 for complains of worsening LEE and had been asked to be seen today.     Past Medical History:  Diagnosis Date  . Arthritis   . Cancer ([place])    skin cancer,melonoma on nose  . Diabetes mellitus   . GERD (gastroesophageal reflux disease)   . Hyperlipidemia   . Hypertension   . Myocardial infarction ([place])    1990  . Neuromuscular disorder ([place])    neuropathy    Past Surgical History:  Procedure Laterality Date  . BACK SURGERY    . CERVICAL LAMINECTOMY    . JOINT REPLACEMENT     right knee  . PROSTATE SURGERY    . ROTATOR CUFF REPAIR    . SKIN CANCER EXCISION     eyelid and melonoma on nose  . TOTAL KNEE ARTHROPLASTY  09/08/2012   Procedure: TOTAL KNEE ARTHROPLASTY;  Surgeon: Rudean Haskell, MD;  Location: [facility];  Service: Orthopedics;  Laterality: Left;  left total knee arthroplasty     Current Outpatient Medications  Medication Sig Dispense Refill  . allopurinol (ZYLOPRIM) 300 MG tablet Take 1 tablet by mouth daily.    Marland Kitchen amiodarone (PACERONE) 200 MG tablet Take 1 tablet (200 mg total) by mouth daily. 90 tablet 3  . apixaban (ELIQUIS) 2.5 MG TABS tablet Take 1 tablet (2.5 mg total) by mouth 2 (two) times daily. 180 tablet 1  . escitalopram  (LEXAPRO) 10 MG tablet Take 0.5 tablets (5 mg total) by mouth daily. (Patient taking differently: Take 10 mg by mouth daily. ) 45 tablet 3  . furosemide (LASIX) 40 MG tablet Take 1 tablet (40 mg total) by mouth daily. 30 tablet 0  . Gabapentin Enacarbil (HORIZANT) 600 MG TBCR Take 600 mg by mouth at bedtime.     . Magnesium Oxide 400 MG CAPS Take 400 mg by mouth daily.    . metFORMIN (GLUCOPHAGE-XR) 500 MG 24 hr tablet Take 1,000 mg by mouth 2 (two) times daily.  1  . metolazone (ZAROXOLYN) 2.5 MG tablet Take 1 tablet every 2-3 days (Patient taking differently: Take 2.5 mg by mouth See admin instructions. Take 2.5 mg tablet every morning every 5 days) 30 tablet 3  . metoprolol succinate (TOPROL-XL) 100 MG 24 hr tablet TAKE 1 TABLET BY MOUTH DAILY (Patient taking differently: Take 100 mg by mouth daily. ) 30 tablet 6  . Multiple Vitamins-Minerals (ICAPS PO) Take 1 tablet by mouth daily at 12 noon.     Marland Kitchen NAMENDA XR 28 MG CP24 24 hr capsule Take 28 mg by mouth daily.     Marland Kitchen omeprazole (PRILOSEC) 20 MG capsule Take 20 mg by mouth daily.    Marland Kitchen oxyCODONE (OXY IR/ROXICODONE) 5  MG immediate release tablet Take 1-2 tablets (5-10 mg total) by mouth every 4 (four) hours as needed. (Patient taking differently: Take 5-10 mg by mouth 2 (two) times daily. ) 90 tablet 0  . potassium chloride SA (K-DUR,KLOR-CON) 20 MEQ tablet Take 1 tablet (20 mEq total) by mouth daily. 30 tablet 0  . rosuvastatin (CRESTOR) 5 MG tablet Take 5 mg by mouth daily.  1  . silver sulfADIAZINE (SILVADENE) 1 % cream Apply 1 application topically as needed (open sores legs).    Marland Kitchen Specialty Vitamins Products (ICAPS LUTEIN & ZEAXANTHIN) TBEC Take 1 tablet by mouth daily at 12 noon.    Marland Kitchen testosterone cypionate (DEPOTESTOSTERONE CYPIONATE) 200 MG/ML injection Inject 200 mg into the muscle every 14 (fourteen) days.     No current facility-administered medications for this visit.     Allergies:   Patient has no known allergies.    Social  History:  The patient  reports that he has quit smoking. His smoking use included cigarettes. He has a 12.00 pack-year smoking history. He has quit using smokeless tobacco.  His smokeless tobacco use included chew. He reports that he does not drink alcohol or use drugs.   Family History:  The patient's family history is not on file.    ROS: All other systems are reviewed and negative. Unless otherwise mentioned in H&P    PHYSICAL EXAM: VS:  There were no vitals taken for this visit. , BMI There is no height or weight on file to calculate BMI. GEN: Well nourished, well developed, in no acute distress HEENT: normal Neck: no JVD, carotid bruits, or masses Cardiac: ***RRR; no murmurs, rubs, or gallops,no edema  Respiratory:  Clear to auscultation bilaterally, normal work of breathing GI: soft, nontender, nondistended, + BS MS: no deformity or atrophy Skin: warm and dry, no rash Neuro:  Strength and sensation are intact Psych: euthymic mood, full affect   EKG:  EKG {ACTION; IS/IS XIP:[id number]} ordered today. The ekg ordered today demonstrates ***   Recent Labs: 01/07/2019: ALT 26; Hemoglobin 12.4; Platelets 124 01/09/2019: BUN 19; Creatinine, Ser 1.39; Magnesium 2.2; Potassium 3.8; Sodium 136    Lipid Panel No results found for: CHOL, TRIG, HDL, CHOLHDL, VLDL, LDLCALC, LDLDIRECT    Wt Readings from Last 3 Encounters:  01/07/19 201 lb (91.2 kg)  09/15/18 210 lb 9.6 oz (95.5 kg)  06/11/18 218 lb 12.8 oz (99.2 kg)      Other studies Reviewed: Echocardiogram Mar 27, 2018 Procedure narrative: Transthoracic echocardiography for   diagnosis, for left ventricular function evaluation, for right   ventricular function evaluation, and for assessment of valvular   function. Image quality was adequate. The study was technically   difficult, as a result of poor sound wave transmission.   Intravenous contrast (Definity) was administered to opacify the   LV. - Left ventricle: The cavity size  was normal. Systolic function was   mildly reduced. The estimated ejection fraction was in the range   of 45% to 50%. Diffuse hypokinesis. The study was not technically   sufficient to allow evaluation of LV diastolic dysfunction due to   atrial fibrillation. Mild focal basal septal hypertrophy. - Aortic valve: There was mild to moderate regurgitation. - Mitral valve: Mildly calcified annulus. Normal thickness leaflets   . There was mild regurgitation. - Left atrium: The atrium was mildly dilated. - Right ventricle: The cavity size was moderately dilated. Systolic   function was reduced. - Right atrium: The atrium was moderately dilated. - Atrial septum:  No defect or patent foramen ovale was identified. - Tricuspid valve: There was moderate regurgitation. - Pulmonic valve: There was mild regurgitation. - Pulmonary arteries: PA peak pressure: 35 mm Hg (S). - Systemic veins: The IVC is dilated with normal respiratory   variation. Estimated CVP 8 mmHg.  ASSESSMENT AND PLAN:  1.  ***   Current medicines are reviewed at length with the patient today.    Labs/ tests ordered today include: *** Phill Myron. [place] Pugh, ANP, [facility]   01/14/2019 8:13 AM    [facility] [address] Office [phone] Fax [phone]

## 2019-01-19 ENCOUNTER — Encounter: Payer: Self-pay | Admitting: Cardiovascular Disease

## 2019-01-19 ENCOUNTER — Ambulatory Visit (INDEPENDENT_AMBULATORY_CARE_PROVIDER_SITE_OTHER): Payer: Medicare Other | Admitting: Cardiovascular Disease

## 2019-01-19 VITALS — BP 124/66 | HR 90 | Ht 71.0 in | Wt 207.0 lb

## 2019-01-19 DIAGNOSIS — I4819 Other persistent atrial fibrillation: Secondary | ICD-10-CM | POA: Diagnosis not present

## 2019-01-19 DIAGNOSIS — R600 Localized edema: Secondary | ICD-10-CM

## 2019-01-19 DIAGNOSIS — I2510 Atherosclerotic heart disease of native coronary artery without angina pectoris: Secondary | ICD-10-CM

## 2019-01-19 DIAGNOSIS — N183 Chronic kidney disease, stage 3 unspecified: Secondary | ICD-10-CM

## 2019-01-19 DIAGNOSIS — I10 Essential (primary) hypertension: Secondary | ICD-10-CM

## 2019-01-19 DIAGNOSIS — I452 Bifascicular block: Secondary | ICD-10-CM

## 2019-01-19 DIAGNOSIS — Z7901 Long term (current) use of anticoagulants: Secondary | ICD-10-CM | POA: Diagnosis not present

## 2019-01-19 DIAGNOSIS — L03119 Cellulitis of unspecified part of limb: Secondary | ICD-10-CM

## 2019-01-19 MED ORDER — FUROSEMIDE 40 MG PO TABS: ORAL_TABLET | ORAL | 0 refills | Status: DC

## 2019-01-19 MED ORDER — METOLAZONE 2.5 MG PO TABS: ORAL_TABLET | ORAL | 3 refills | Status: DC

## 2019-01-19 MED ORDER — CEPHALEXIN 500 MG PO CAPS: 500.0000 mg | ORAL_CAPSULE | Freq: Three times a day (TID) | ORAL | 0 refills | Status: DC

## 2019-01-19 NOTE — Progress Notes (Signed)
Patient ID: [name], male   DOB: 06-24-1934, 85 y.o.   MRN: [id number]     Primary:  Ethan Scales, PA-C  HPI: [name] is a 85 y.o. male who presents to the office today for a 4 month follow-up cardiology evaluation.  [name]  has documented mild CAD by catheterization in 2006 which has been treated medically.   Additional problems include hypertension, type 2 diabetes mellitus, hyperlipidemia, obesity, and  peripheral neuropathy.  He underwent knee replacement surgery in 2013 well without cardiovascular compromise. An echo Doppler study  showed an EF of 55% with mild LVH, mild LA dilatation, mild to moderate mitral annular calcification trace MR, and mild pulmonary hypertension with mild TR with estimated pressure 32 mm. There was aortic sclerosis without stenosis with mild aortic insufficiency and mild pulmonic insufficiency.  When I last saw him in November 2018 he denied any  recurrent episodes of chest tightness.  He has chronic right bundle branch block.  He was unaware of palpitations.  He was on Crestor L and fish oil for hyperlipidemia, metformin for diabetes mellitus, and was taking torsemide 20 mg twice a day both for blood pressure and ankle edema in addition to Toprol-XL 50 mg and quinapril HCT 20/25 mg for hypertension control.  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  He was on Namenda for memory.   He was hospitalized at [facility] in January 2018 in the setting of dehydration and had experienced several falls.  He was found to be in atrial fibrillation with RVR.  He was started on Eliquis 5 mg twice a day.  His metoprolol dose was increased.  He was found to have liver steatosis.  An echo Doppler study showed an EF of 40% which was reduced from previously at 55%.  He apparently was rehospitalized with volume overload at [facility] overnight on February 27, 2018.  Was felt to have possible left lower lobe pneumonia on chest  x-ray in the emergency room.  Creatinine was 1.4.  He was felt to have acute on chronic systolic heart failure with EF around 45% and there was evidence for fluid overload.  He was placed on high-dose Lasix along with Zaroxolyn for 1 dose and additional medications were adjusted.  He was 1.9 at admission which improved.  He had follow-up lab work yesterday done at [facility] which now shows his creatinine at 1.05.  BNP was 367.  He is breathing better since his hospitalization.  He admits to an 11 pound weight loss.    I saw him March 07, 2018 for follow-up of his hospitalization.  He had lost 11 pounds since his hospitalization.  Continues to have 2-3+ lower extremity edema.  Renal function had improved.  I increase his Lasix to 80 mg in the morning and 60 mg in the afternoon.  He is feeling better.  His edema has improved.  Echo Doppler study from March 21, 2018 showed an EF of 45 to 50%.  There was diffuse hypo-kinesis, mild MR and mild left atrial dilatation with moderate right atrial dilatation.  PA peak pressure estimate was 35 mm.    I saw him in May 2019, he was still in atrial fibrillation on Eliquis.  I started amiodarone 200 mg twice a day for 3 days and then only recommended he stay on 200 mg daily.  QTc interval is increased and I recommended he reduce his Lexapro down to 5  mg.  Blood pressure was stable and he was continuing Lasix for diuresis.  When Isaw him on May 05, 2018  he continued to have 3+ pitting edema.  At that time I recommended initiation of metolazone 2.5 mg 30 minutes prior to his morning Lasix dose and do this every other day for 3 days and then change to every third day.  I reduced his lisinopril to 2.5 mg.  Laboratory was repeated and subsequent blood work 1 week later show his his creatinine had increased to 1.52.  Scheduled him for 1 week follow-up evaluation.    When I saw him for follow-up on May 15, 2018 and he had had significant improvement in his leg  swelling and was breathing better.  Laboratory by his primary physician had shown an increase in creatinine at 2.0, sodium 134, potassium 4.3.  As result, at that office visit I reduced his metolazone to 2.5 mg weekly and reduced his Lasix to 80 mg in the morning and 40 mg in the afternoon.  A subsequent bmet on May 19, 2018 showed improvement in his creatinine at 1.6.  He has noticed some slight increase in his pretibial edema.  His weight has been constant at 218.     In July 2019 he was in atrial fibrillation with ventricular rate in the 70s and his weight had been stable around 218.  I last saw him in October 2019 at that time he was  taking metolazone every third day since when he tried going every fifth day the swelling recurred.  He denies recent chest pain.  He denies bleeding on Eliquis.  He continues to be on rosuvastatin.  He had recent laboratory done at [facility] September 11, 2018.  Creatinine was 1.49.  LFTs minimally increased with an AST at 50 and a normal ALT at 33.  His estimated GFR was 43.  He was not anemic.    Since I last saw him, his wife states that his diuretic regimen was reduced due to renal insufficiency.  On January 07, 2019 he was admitted to [facility] and was discharged on January 09, 2019.  He presented with a 7-day course of frequent falls.  There was no loss of consciousness.  He received IV fluid overnight on admission.  Orthostatic vital signs were negative.  Ultimately his dose of furosemide was reduced.  He was taken metolazone 2.5 mg every third day.  According to the patient's daughter, that the patient gained to give again fluid retention with reduction of his diuretic regimen to the point where as of last Friday his weight had risen from 192 up to 215.  He was seen by [name]- Maurie Boettcher in [place] and was advised to go to the hospital.  Cording to the patient started the patient's legs were extremely red.  He was not admitted and sent home and  told to take furosemide 40 mg twice a day.  Over the weekend, he continues to experience significant leg swelling and redness to his legs.  Denies fevers.  He is breathing better.  He has lost from 215 pounds down to 207 over the weekend.  He is now worked in and seen in the office today for follow-up evaluation.  Past Medical History:  Diagnosis Date  . Arthritis   . Cancer ([place])    skin cancer,melonoma on nose  . Diabetes mellitus   . GERD (gastroesophageal reflux disease)   . Hyperlipidemia   . Hypertension   . Myocardial  infarction ([place])    1990  . Neuromuscular disorder ([place])    neuropathy    Past Surgical History:  Procedure Laterality Date  . BACK SURGERY    . CERVICAL LAMINECTOMY    . JOINT REPLACEMENT     right knee  . PROSTATE SURGERY    . ROTATOR CUFF REPAIR    . SKIN CANCER EXCISION     eyelid and melonoma on nose  . TOTAL KNEE ARTHROPLASTY  09/08/2012   Procedure: TOTAL KNEE ARTHROPLASTY;  Surgeon: Rudean Haskell, MD;  Location: [place];  Service: Orthopedics;  Laterality: Left;  left total knee arthroplasty    No Known Allergies  Current Outpatient Medications  Medication Sig Dispense Refill  . allopurinol (ZYLOPRIM) 300 MG tablet Take 1 tablet by mouth daily.    Marland Kitchen amiodarone (PACERONE) 200 MG tablet Take 1 tablet (200 mg total) by mouth daily. 90 tablet 3  . apixaban (ELIQUIS) 2.5 MG TABS tablet Take 1 tablet (2.5 mg total) by mouth 2 (two) times daily. 180 tablet 1  . escitalopram (LEXAPRO) 10 MG tablet Take 0.5 tablets (5 mg total) by mouth daily. (Patient taking differently: Take 10 mg by mouth daily. ) 45 tablet 3  . furosemide (LASIX) 40 MG tablet Take 2 tablets (80 mg) in the morning, take 1 tablet (40 mg ) in the evening 180 tablet 0  . Gabapentin Enacarbil (HORIZANT) 600 MG TBCR Take 600 mg by mouth at bedtime.     . Magnesium Oxide 400 MG CAPS Take 400 mg by mouth daily.    . metFORMIN (GLUCOPHAGE-XR) 500 MG 24 hr tablet Take 1,000 mg by mouth 2 (two)  times daily.  1  . metolazone (ZAROXOLYN) 2.5 MG tablet Take 2.5 mg every other day, 20 minutes before lasix dose 30 tablet 3  . metoprolol succinate (TOPROL-XL) 100 MG 24 hr tablet TAKE 1 TABLET BY MOUTH DAILY (Patient taking differently: Take 100 mg by mouth daily. ) 30 tablet 6  . Multiple Vitamins-Minerals (ICAPS PO) Take 1 tablet by mouth daily at 12 noon.     Marland Kitchen NAMENDA XR 28 MG CP24 24 hr capsule Take 28 mg by mouth daily.     Marland Kitchen omeprazole (PRILOSEC) 20 MG capsule Take 20 mg by mouth daily.    Marland Kitchen oxyCODONE (OXY IR/ROXICODONE) 5 MG immediate release tablet Take 1-2 tablets (5-10 mg total) by mouth every 4 (four) hours as needed. (Patient taking differently: Take 5-10 mg by mouth 2 (two) times daily. ) 90 tablet 0  . potassium chloride SA (K-DUR,KLOR-CON) 20 MEQ tablet Take 1 tablet (20 mEq total) by mouth daily. 30 tablet 0  . rosuvastatin (CRESTOR) 5 MG tablet Take 5 mg by mouth daily.  1  . silver sulfADIAZINE (SILVADENE) 1 % cream Apply 1 application topically as needed (open sores legs).    Marland Kitchen Specialty Vitamins Products (ICAPS LUTEIN & ZEAXANTHIN) TBEC Take 1 tablet by mouth daily at 12 noon.    Marland Kitchen testosterone cypionate (DEPOTESTOSTERONE CYPIONATE) 200 MG/ML injection Inject 200 mg into the muscle every 14 (fourteen) days.    . cephALEXin (KEFLEX) 500 MG capsule Take 1 capsule (500 mg total) by mouth every 8 (eight) hours. 30 capsule 0   No current facility-administered medications for this visit.     Social History   Socioeconomic History  . Marital status: Married    Spouse name: Not on file  . Number of children: Not on file  . Years of education: Not on file  .  Highest education level: Not on file  Occupational History  . Not on file  Social Needs  . Financial resource strain: Not on file  . Food insecurity:    Worry: Not on file    Inability: Not on file  . Transportation needs:    Medical: Not on file    Non-medical: Not on file  Tobacco Use  . Smoking status: Former  Smoker    Packs/day: 1.00    Years: 12.00    Pack years: 12.00    Types: Cigarettes  . Smokeless tobacco: Former Systems developer    Types: Chew  Substance and Sexual Activity  . Alcohol use: No  . Drug use: No  . Sexual activity: Not on file  Lifestyle  . Physical activity:    Days per week: Not on file    Minutes per session: Not on file  . Stress: Not on file  Relationships  . Social connections:    Talks on phone: Not on file    Gets together: Not on file    Attends religious service: Not on file    Active member of club or organization: Not on file    Attends meetings of clubs or organizations: Not on file    Relationship status: Not on file  . Intimate partner violence:    Fear of current or ex partner: Not on file    Emotionally abused: Not on file    Physically abused: Not on file    Forced sexual activity: Not on file  Other Topics Concern  . Not on file  Social History Narrative  . Not on file   Socially, he is widowed. There is no tobacco history or alcohol. He completed 10th grade education. He is retired per he does use a cane.  Parents are deceased.  ROS General: Negative; No fevers, chills, or night sweats;  HEENT: Negative; No changes in vision or hearing, sinus congestion, difficulty swallowing Pulmonary: Negative; No cough, wheezing, shortness of breath, hemoptysis Cardiovascular: See HPI  lower extremity swelling. GI: Positive for GERD; No nausea, vomiting, diarrhea, or abdominal pain GU: Negative; No dysuria, hematuria, or difficulty voiding Musculoskeletal: Arthritic symptoms of the shoulders and hips; walks with a cane Hematologic/Oncology: Negative; no easy bruising, bleeding Endocrine: Positive for diabetes mellitus Neuro: Negative; no changes in balance, headaches Skin: Negative; No rashes or skin lesions Psychiatric: Negative; No behavioral problems, depression Sleep: Negative; No snoring, daytime sleepiness, hypersomnolence, bruxism, restless legs,  hypnogognic hallucinations, no cataplexy Other comprehensive 14 point system review is negative.   PE BP 124/66   Pulse 90   Ht '5\' 11"'$  (1.803 m)   Wt 207 lb (93.9 kg)   BMI 28.87 kg/m    Repeat blood pressure today was 126/70.  Wt Readings from Last 3 Encounters:  01/19/19 207 lb (93.9 kg)  01/07/19 201 lb (91.2 kg)  09/15/18 210 lb 9.6 oz (95.5 kg)   General: Alert, oriented, no distress.  Skin: normal turgor, no rashes, warm and dry HEENT: Normocephalic, atraumatic. Pupils equal round and reactive to light; sclera anicteric; extraocular muscles intact;  Nose without nasal septal hypertrophy Mouth/Parynx benign; Mallinpatti scale 3 Neck: No JVD, no carotid bruits; normal carotid upstroke Lungs: clear to ausculatation and percussion; no wheezing or rales Chest wall: without tenderness to palpitation Heart: PMI not displaced, RRR, s1 s2 normal, 1/6 systolic murmur, no diastolic murmur, no rubs, gallops, thrills, or heaves Abdomen: Moderate diastases recti, soft, nontender; no hepatosplenomehaly, BS+; abdominal aorta nontender and not dilated  by palpation. Back: no CVA tenderness Pulses 2+ Musculoskeletal: full range of motion, normal strength, no joint deformities Extremities: 3+  pitting edema to below the knees with significant erythema, oozing of clear fluid.  No clubbing cyanosis, Homan's sign negative  Neurologic: grossly nonfocal; Cranial nerves grossly wnl Psychologic: Normal mood and affect   ECG (independently read by me): Atrial fibrillation; RBBB, LAHB,   October 21/2019 ECG (independently read by me): Normal sinus rhythm at 64 bpm.  Mild sinus arrhythmia.  First-degree AV block with appeared normal at 250 ms.  Right bundle branch block with repolarization changes.  Probable left anterior hemiblock  June 11, 2018 ECG (independently read by me): Atrial fibrillation at 83 bpm.  Right bundle branch block with repolarization changes.  Left anterior hemiblock.  May 15, 2018 ECG (independently read by me): Atrial fibrillation at 74 bpm.  Right bundle branch block with repolarization changes.  Left anterior hemiblock.  May 05, 2018 ECG (independently read by me): Atrial fibrillation at 80 bpm.  Right bundle branch block with repolarization changes.  May 2019 ECG (independently read by me): Atrial fibrillation at 75 bpm.  QT interval 438, QTc 489 ms.  Isolated PVC.  March 07, 2018 ECG (independently read by me): Atrial fibrillation at 82 with PVC or aberrantcy, right bundle branch block.  Old inferior Q waves in 3 and aVF.  November 2018 ECG (independently read by me): Normal sinus rhythm with isolated PAC.  Right bundle branch block with repolarization changes.  Inferior Q-wave  May 2018 ECG (independently read by me): Sinus rhythm at 70 bpm.  PAC.  Right bundle branch block with repolarization changes.  Q waves in lead 3 and aVF.  March 2017 ECG (independently read by me): Normal sinus rhythm at 87 bpm.  Right bundle branch block with repolarization changes.  Left axis deviation.  LVH voltage criteria.  Inferior Q waves , more prominent.  January 2016 ECG (independently read by me): Sinus rhythm at 64 bpm.  Right bundle branch block with repolarization changes.  QTc interval 497 ms.  04/30/2014 ECG (independently read by me): Normal sinus rhythm at 73 beats per minute with right bundle branch block with repolarization changes.  QTc interval 467 ms.  Prior ECG: Normal sinus rhythm with previously noted right bundle branch block at 83 beats per minute. He noted inferior Q waves in III and F.  LABS: Reviewed the patient's recent hospitalization at [facility] and blood work done at  [facility].  I reviewed his recent hospitalization: From February 12 through January 09, 2019.  I did not have records from his [facility] ER evaluation of January 16, 2019  BMP Latest Ref Rng & Units 01/09/2019 01/08/2019 01/07/2019  Glucose 70 - 99 mg/dL  152(H) 180(H) 135(H)  BUN 8 - 23 mg/dL 19 22 31(H)  Creatinine 0.61 - 1.24 mg/dL 1.39(H) 1.52(H) 1.54(H)  BUN/Creat Ratio 10 - 24 - - -  Sodium 135 - 145 mmol/L 136 138 136  Potassium 3.5 - 5.1 mmol/L 3.8 3.1(L) 3.6  Chloride 98 - 111 mmol/L 103 98 92(L)  CO2 22 - 32 mmol/L '26 28 28  '$ Calcium 8.9 - 10.3 mg/dL 8.5(L) 8.4(L) 8.8(L)   Hepatic Function Latest Ref Rng & Units 01/07/2019 05/05/2018 09/01/2012  Total Protein 6.5 - 8.1 g/dL 6.2(L) 6.5 6.6  Albumin 3.5 - 5.0 g/dL 3.4(L) 4.2 3.6  AST 15 - 41 U/L 36 25 23  ALT 0 - 44 U/L '26 19 24  '$ Alk Phosphatase  38 - 126 U/L 167(H) 102 64  Total Bilirubin 0.3 - 1.2 mg/dL 1.9(H) 0.7 0.7   CBC Latest Ref Rng & Units 01/07/2019 09/10/2012 09/09/2012  WBC 4.0 - 10.5 K/uL 5.7 12.0(H) 9.2  Hemoglobin 13.0 - 17.0 g/dL 12.4(L) 12.6(L) 12.1(L)  Hematocrit 39.0 - 52.0 % 39.8 37.4(L) 36.5(L)  Platelets 150 - 400 K/uL 124(L) 174 140(L)   Lab Results  Component Value Date   MCV 84.1 01/07/2019   MCV 86.4 09/10/2012   MCV 85.3 09/09/2012   No results found for: TSH  Lipid Panel  No results found for: CHOL, TRIG, HDL, CHOLHDL, VLDL, LDLCALC, LDLDIRECT   RADIOLOGY: No results found.  IMPRESSION:  1. Bilateral leg edema   2. Essential hypertension   3. Persistent atrial fibrillation   4. Anticoagulation adequate   5. Chronic kidney disease, stage III (moderate) (HCC)   6. Bifascicular block   7. Cellulitis of lower extremity, unspecified laterality   8. Coronary artery disease involving native coronary artery of native heart without angina pectoris     ASSESSMENT AND PLAN: [name] is an 85 year old gentleman who has a history of mild nonobstructive CAD at cardiac catheterization in 2006 with approximately 20% LAD smooth narrowing proximally.  He denies any anginal symptoms.  His walking is limited due to arthritic symptoms and he walks with a cane.  In January 2019 he developed atrial fibrillation, beta-blocker therapy was increased, and he  was started on anticoagulation with Eliquis.   He had been hospitalized with recent volume overload, questionable left lower lobe pneumonia and was treated with antibiotics and IV diuresis. His most recent echo Doppler study shows an EF of 45 to 50%.  He had mild left atrial dilation and moderate right atrial dilation.  Amiodarone was instituted for persistent atrial fibrillation.  When I last saw him he was in atrial fibrillation with a controlled ventricular rate.  When last seen he was in sinus rhythm and had lost 8 pounds most likely due to volume.  However, the patient now presents with worsening lower extremity edema that was most significant last Friday leading to a [facility] ER evaluation.  He was not admitted and sent home on increased Lasix at 40 mg twice a day and told to continue metolazone every third day.  He continues to be on Toprol-XL 100 mg, amiodarone 200 mg daily and Eliquis 2.5 mg twice a day.  ECG today confirms atrial fibrillation with ventricular rate at 90.  There is bifascicular blockade with right bundle branch block and left anterior hemiblock.  Discussed possible readmission to the hospital today versus an attempt at trying to improve this as an outpatient.  Since he is not having any chest pain, his ventricular rate is controlled, and he is not dyspneic I will try a more aggressive outpatient initial strategy.  With his significant 3+ edema presently I am increasing furosemide to 80 mg in the morning and 40 mg in the late afternoon.  For the next several doses we will change his metolazone to 2.5 mg every other day starting tomorrow.  I am starting him on cephalexin 500 mg every 8 hours in light of his significant erythema and treatment of potential cellulitis.  We discussed the importance of leg elevation and avoidance of sodium.  I brought Almyra Deforest, [facility] into the office today to see his findings.  He will contact the patient later this week to assess potential improvement and if  necessary see him on Friday for follow-up evaluation.  If he is stable he will instead see him in 1 week.  I have recommended he undergo follow-up chemistry profile in 3 days with his increased diuretic regimen and metolazone to reassess renal function as well as potassium status.  Time spent: 40 minutes  Troy Sine, MD, [facility]  01/19/2019 5:59 PM

## 2019-01-19 NOTE — Patient Instructions (Signed)
Medication Instructions:  Increase Lasix to 80 mg (2 tablets) in the morning, and 40 mg (1 tablet) in the evening. Take Metolazone every other day, 20 minutes before lasix dose. Take Keflex every 8 hours for 10 days.  If you need a refill on your cardiac medications before your next appointment, please call your pharmacy.   Lab work: CMET on Thursday  If you have labs (blood work) drawn today and your tests are completely normal, you will receive your results only by: Marland Kitchen MyChart Message (if you have MyChart) OR . A paper copy in the mail If you have any lab test that is abnormal or we need to change your treatment, we will call you to review the results.   Follow-Up: At [facility], you and your health needs are our priority.  As part of our continuing mission to provide you with exceptional heart care, we have created designated Provider Care Teams.  These Care Teams include your primary Cardiologist (physician) and Advanced Practice Providers (APPs -  Physician Assistants and Nurse Practitioners) who all work together to provide you with the care you need, when you need it. . Follow up with Almyra Deforest, PA-C on Monday 01/26/2019 at 4:00 PM. Almyra Deforest, PA will call you on Friday to check in.

## 2019-01-23 ENCOUNTER — Telehealth: Payer: Self-pay | Admitting: Physician Assistant

## 2019-01-23 NOTE — Telephone Encounter (Signed)
I have called and spoke to his daughter.  He was seen by his primary care provider yesterday, his weight has dropped by about 4 pounds.  However he continued to have significant lower extremity erythema even on the Keflex.  He denies any obvious orthopnea or PND.  I will continue on the current therapy and reassess the patient next Monday.  He apparently had lab work today, however I am only able to see the lab result.  I asked his daughter to forward a copy of the lab report to me.   Hilbert Corrigan PA Pager: [phone]

## 2019-01-25 NOTE — Telephone Encounter (Signed)
Thanks for the update

## 2019-01-26 ENCOUNTER — Ambulatory Visit (INDEPENDENT_AMBULATORY_CARE_PROVIDER_SITE_OTHER): Payer: Medicare Other | Admitting: Physician Assistant

## 2019-01-26 ENCOUNTER — Encounter: Payer: Self-pay | Admitting: Physician Assistant

## 2019-01-26 VITALS — BP 130/80 | HR 104 | Ht 69.0 in | Wt 202.0 lb

## 2019-01-26 DIAGNOSIS — I2510 Atherosclerotic heart disease of native coronary artery without angina pectoris: Secondary | ICD-10-CM | POA: Diagnosis not present

## 2019-01-26 DIAGNOSIS — R600 Localized edema: Secondary | ICD-10-CM | POA: Diagnosis not present

## 2019-01-26 DIAGNOSIS — I480 Paroxysmal atrial fibrillation: Secondary | ICD-10-CM

## 2019-01-26 DIAGNOSIS — Z79899 Other long term (current) drug therapy: Secondary | ICD-10-CM

## 2019-01-26 DIAGNOSIS — I119 Hypertensive heart disease without heart failure: Secondary | ICD-10-CM

## 2019-01-26 DIAGNOSIS — E785 Hyperlipidemia, unspecified: Secondary | ICD-10-CM

## 2019-01-26 DIAGNOSIS — E119 Type 2 diabetes mellitus without complications: Secondary | ICD-10-CM

## 2019-01-26 MED ORDER — FUROSEMIDE 40 MG PO TABS: 80.0000 mg | ORAL_TABLET | Freq: Two times a day (BID) | ORAL | 6 refills | Status: DC

## 2019-01-26 MED ORDER — POTASSIUM CHLORIDE CRYS ER 20 MEQ PO TBCR: 20.0000 meq | EXTENDED_RELEASE_TABLET | Freq: Two times a day (BID) | ORAL | 0 refills | Status: DC

## 2019-01-26 NOTE — Patient Instructions (Signed)
Medication Instructions:  INCREASE LASIX 80MG  TWICE DAILY If you need a refill on your cardiac medications before your next appointment, please call your pharmacy.  Labwork: BMET IN 1 WEEK HERE IN OUR OFFICE AT [facility]    You will NOT need to fast   Take the provided lab slips with you to the lab for your blood draw.   When you have your labs (blood work) drawn today and your tests are completely normal, you will receive your results only by MyChart Message (if you have MyChart) -OR-  A paper copy in the mail.  If you have any lab test that is abnormal or we need to change your treatment, we will call you to review these results.  Follow-Up: You will need a follow up appointment in 2-3 weeks WITH HAO MENG PA-C. You may see Shelva Majestic, MD or one of the following Advanced Practice Providers on your designated Care Team:  Almyra Deforest, PA-C  Fabian Sharp, PA-C     At [facility], you and your health needs are our priority.  As part of our continuing mission to provide you with exceptional heart care, we have created designated Provider Care Teams.  These Care Teams include your primary Cardiologist (physician) and Advanced Practice Providers (APPs -  Physician Assistants and Nurse Practitioners) who all work together to provide you with the care you need, when you need it.  Thank you for choosing [facility] at [facility]!!

## 2019-01-26 NOTE — Progress Notes (Signed)
Cardiology Office Note    Date:  01/29/2019   ID:  [name], DOB 1934/02/17, MRN [id number]  PCP:  Ethan Riches, NP  Cardiologist:  Dr. Claiborne [name]  Chief Complaint  Patient presents with  . Follow-up    seen for Dr. Claiborne [name].     History of Present Illness:  [name] is a 85 y.o. male with PMH of hypertension, hyperlipidemia, DM 2, obesity, peripheral neuropathy and CAD.  He had mild CAD by cardiac catheterization in 2006 that was managed medically.  Echocardiogram in March 2017 showed EF of 55 to 60%, grade 1 DD, mild AI.  He was hospitalized at [name] in January 2018 with dehydration and experienced several falls.  He was found to be in atrial fibrillation with RVR.  Eliquis was started and metoprolol was increased.  He was also found to have liver steatosis.  Echo Doppler showed EF of 40% which has reduced from the previous 55%.  Later he was recently hospitalized in April 2019 with volume overload.  Last Doppler obtained in April 2019 showed EF of 45 to 50%, diffuse hypokinesis, mild MR and mild LAE, peak PA pressure 35 mmHg.  He was seated admitted in February 2020 with frequent falls.  This was treated with IV fluid.  Orthostatic vital signs were negative.  His diuretic dose was reduced.  He presented to the cardiology office on 01/19/2019 with significant volume overload.  His weight also increased from 192 pounds up to 215 pounds.  He was sent to the [facility], however he was not admitted and was instructed instead to take the Lasix 40 mg twice daily.  He was he recently evaluated by Dr. Claiborne [name] last week, he had weeping lower extremity swelling at the time.  His metolazone was increased to every other day instead.  Lasix was also increased as well for increased diuresis.  He is continuing to diurese at this time.  He is weight has decreased from 207 pounds to 202 pounds.  He is still 10 pounds over his baseline dry weight and appears to have at least 1+ pitting  edema.  I will further increase the Lasix to 80 mg twice daily and to continue to diurese him.  He will need 1 week basic metabolic panel and I will reassess him in 2 to 3 weeks.   Past Medical History:  Diagnosis Date  . Arthritis   . Cancer ([place])    skin cancer,melonoma on nose  . Diabetes mellitus   . GERD (gastroesophageal reflux disease)   . Hyperlipidemia   . Hypertension   . Myocardial infarction ([name])    1990  . Neuromuscular disorder ([place])    neuropathy    Past Surgical History:  Procedure Laterality Date  . BACK SURGERY    . CERVICAL LAMINECTOMY    . JOINT REPLACEMENT     right knee  . PROSTATE SURGERY    . ROTATOR CUFF REPAIR    . SKIN CANCER EXCISION     eyelid and melonoma on nose  . TOTAL KNEE ARTHROPLASTY  09/08/2012   Procedure: TOTAL KNEE ARTHROPLASTY;  Surgeon: Rudean Haskell, MD;  Location: [place];  Service: Orthopedics;  Laterality: Left;  left total knee arthroplasty    Current Medications: Outpatient Medications Prior to Visit  Medication Sig Dispense Refill  . allopurinol (ZYLOPRIM) 300 MG tablet Take 1 tablet by mouth daily.    Marland Kitchen amiodarone (PACERONE) 200 MG tablet Take 1 tablet (200 mg total)  by mouth daily. 90 tablet 3  . apixaban (ELIQUIS) 2.5 MG TABS tablet Take 1 tablet (2.5 mg total) by mouth 2 (two) times daily. 180 tablet 1  . cephALEXin (KEFLEX) 500 MG capsule Take 1 capsule (500 mg total) by mouth every 8 (eight) hours. 30 capsule 0  . escitalopram (LEXAPRO) 10 MG tablet Take 0.5 tablets (5 mg total) by mouth daily. (Patient taking differently: Take 10 mg by mouth daily. ) 45 tablet 3  . Gabapentin Enacarbil (HORIZANT) 600 MG TBCR Take 600 mg by mouth at bedtime.     . Magnesium Oxide 400 MG CAPS Take 400 mg by mouth daily.    . metFORMIN (GLUCOPHAGE-XR) 500 MG 24 hr tablet Take 1,000 mg by mouth 2 (two) times daily.  1  . metolazone (ZAROXOLYN) 2.5 MG tablet Take 2.5 mg every other day, 20 minutes before lasix dose 30 tablet 3  .  metoprolol succinate (TOPROL-XL) 100 MG 24 hr tablet TAKE 1 TABLET BY MOUTH DAILY (Patient taking differently: Take 100 mg by mouth daily. ) 30 tablet 6  . Multiple Vitamins-Minerals (ICAPS PO) Take 1 tablet by mouth daily at 12 noon.     Marland Kitchen NAMENDA XR 28 MG CP24 24 hr capsule Take 28 mg by mouth daily.     Marland Kitchen omeprazole (PRILOSEC) 20 MG capsule Take 20 mg by mouth daily.    Marland Kitchen oxyCODONE (OXY IR/ROXICODONE) 5 MG immediate release tablet Take 1-2 tablets (5-10 mg total) by mouth every 4 (four) hours as needed. (Patient taking differently: Take 5-10 mg by mouth 2 (two) times daily. ) 90 tablet 0  . rosuvastatin (CRESTOR) 5 MG tablet Take 5 mg by mouth daily.  1  . silver sulfADIAZINE (SILVADENE) 1 % cream Apply 1 application topically as needed (open sores legs).    Marland Kitchen Specialty Vitamins Products (ICAPS LUTEIN & ZEAXANTHIN) TBEC Take 1 tablet by mouth daily at 12 noon.    Marland Kitchen testosterone cypionate (DEPOTESTOSTERONE CYPIONATE) 200 MG/ML injection Inject 200 mg into the muscle every 14 (fourteen) days.    . furosemide (LASIX) 40 MG tablet Take 2 tablets (80 mg) in the morning, take 1 tablet (40 mg ) in the evening 180 tablet 0  . potassium chloride SA (K-DUR,KLOR-CON) 20 MEQ tablet Take 1 tablet (20 mEq total) by mouth daily. 30 tablet 0   No facility-administered medications prior to visit.      Allergies:   Patient has no known allergies.   Social History   Socioeconomic History  . Marital status: Married    Spouse name: Not on file  . Number of children: Not on file  . Years of education: Not on file  . Highest education level: Not on file  Occupational History  . Not on file  Social Needs  . Financial resource strain: Not on file  . Food insecurity:    Worry: Not on file    Inability: Not on file  . Transportation needs:    Medical: Not on file    Non-medical: Not on file  Tobacco Use  . Smoking status: Former Smoker    Packs/day: 1.00    Years: 12.00    Pack years: 12.00    Types:  Cigarettes  . Smokeless tobacco: Former Systems developer    Types: Chew  Substance and Sexual Activity  . Alcohol use: No  . Drug use: No  . Sexual activity: Not on file  Lifestyle  . Physical activity:    Days per week: Not on file  Minutes per session: Not on file  . Stress: Not on file  Relationships  . Social connections:    Talks on phone: Not on file    Gets together: Not on file    Attends religious service: Not on file    Active member of club or organization: Not on file    Attends meetings of clubs or organizations: Not on file    Relationship status: Not on file  Other Topics Concern  . Not on file  Social History Narrative  . Not on file     Family History:  The patient's family history is not on file.   ROS:   Please see the history of present illness.    ROS All other systems reviewed and are negative.   PHYSICAL EXAM:   VS:  BP 130/80   Pulse (!) 104   Ht 5\' 9"  (1.753 m)   Wt 202 lb (91.6 kg)   BMI 29.83 kg/m    GEN: Well nourished, well developed, in no acute distress  HEENT: normal  Neck: no JVD, carotid bruits, or masses Cardiac: Irregularly irregular; no murmurs, rubs, or gallops. 1+ edema  Respiratory:  clear to auscultation bilaterally, normal work of breathing GI: soft, nontender, nondistended, + BS MS: no deformity or atrophy  Skin: warm and dry, no rash Neuro:  Alert and Oriented x 3, Strength and sensation are intact Psych: euthymic mood, full affect  Wt Readings from Last 3 Encounters:  01/26/19 202 lb (91.6 kg)  01/19/19 207 lb (93.9 kg)  01/07/19 201 lb (91.2 kg)      Studies/Labs Reviewed:   EKG:  EKG is ordered today.  The ekg ordered today demonstrates atrial fibrillation, RBBB  Recent Labs: 01/07/2019: ALT 26; Hemoglobin 12.4; Platelets 124 01/09/2019: BUN 19; Creatinine, Ser 1.39; Magnesium 2.2; Potassium 3.8; Sodium 136   Lipid Panel No results found for: CHOL, TRIG, HDL, CHOLHDL, VLDL, LDLCALC, LDLDIRECT  Additional studies/  records that were reviewed today include:   Echo 02/20/2016 LV EF: 55% -   60% Study Conclusions  - Left ventricle: The cavity size was normal. Wall thickness was   increased in a pattern of mild LVH. Systolic function was normal.   The estimated ejection fraction was in the range of 55% to 60%.   There is hypokinesis of the basalinferolateral myocardium.   Doppler parameters are consistent with abnormal left ventricular   relaxation (grade 1 diastolic dysfunction). Doppler parameters   are consistent with high ventricular filling pressure. - Aortic valve: There was mild regurgitation. - Mitral valve: Calcified annulus. - Left atrium: The atrium was mildly dilated. - Right atrium: The atrium was mildly dilated. - Pulmonary arteries: Systolic pressure was mildly increased.  Impressions:  - Hypokinesis of the basal inferior lateral wall with overall   preserved LV function; grade 1 diastolic dysfunction; elevated LV   filling pressure; mild AI; mild biatrial enlargement; trace TR   with mildly elevated pulmonary pressure.    ASSESSMENT:    1. Bilateral lower extremity edema   2. Hypertensive heart disease without heart failure   3. Medication management   4. Hyperlipidemia LDL goal <70   5. Controlled type 2 diabetes mellitus without complication, without long-term current use of insulin ([place])   6. PAF (paroxysmal atrial fibrillation) (H[facility])      PLAN:  In order of problems listed above:  1. Bilateral lower extremity edema: Improved with diuresis, he is still 10 pounds over his baseline dry weight.  I  will increase Lasix to 80 mg twice daily and continue on the current dose of the metolazone.  2. PAF: Based on the EKG finding, he likely converted back into atrial fibrillation since at least February of this year.  We will continue on amiodarone, metoprolol and Eliquis.  If diuresis continues to be a problem, may have to consider cardioversion at some point  again.  3. Hypertension: Blood pressure stable  4. DM2: Managed by primary care provider  5. Hyperlipidemia: On Crestor 5 mg daily.    Medication Adjustments/Labs and Tests Ordered: Current medicines are reviewed at length with the patient today.  Concerns regarding medicines are outlined above.  Medication changes, Labs and Tests ordered today are listed in the Patient Instructions below. Patient Instructions  Medication Instructions:  INCREASE LASIX 80MG  TWICE DAILY If you need a refill on your cardiac medications before your next appointment, please call your pharmacy.  Labwork: BMET IN 1 WEEK HERE IN OUR OFFICE AT [facility]    You will NOT need to fast   Take the provided lab slips with you to the lab for your blood draw.   When you have your labs (blood work) drawn today and your tests are completely normal, you will receive your results only by MyChart Message (if you have MyChart) -OR-  A paper copy in the mail.  If you have any lab test that is abnormal or we need to change your treatment, we will call you to review these results.  Follow-Up: You will need a follow up appointment in 2-3 weeks WITH Makenze Ellett PA-C. You may see Shelva Majestic, MD or one of the following Advanced Practice Providers on your designated Care Team:  Almyra Deforest, PA-C  Fabian Sharp, PA-C     At [facility], you and your health needs are our priority.  As part of our continuing mission to provide you with exceptional heart care, we have created designated Provider Care Teams.  These Care Teams include your primary Cardiologist (physician) and Advanced Practice Providers (APPs -  Physician Assistants and Nurse Practitioners) who all work together to provide you with the care you need, when you need it.  Thank you for choosing [facility] at [facility], [place]  01/29/2019 12:06 AM    [address], [place], [place]  [address] Phone: [phone];  Fax: [phone]

## 2019-01-28 ENCOUNTER — Encounter: Payer: Self-pay | Admitting: Physician Assistant

## 2019-02-16 NOTE — Progress Notes (Signed)
The patient's daughter Omar Person who is on the patient's DPR has been notified of the results and medication change, Mrs. Dallas Breeding also verbalized understanding.  All questions (if any) were answered. Jacqulynn Cadet, [place] 02/16/2019 2:55 PM

## 2019-02-19 DIAGNOSIS — I10 Essential (primary) hypertension: Secondary | ICD-10-CM

## 2019-02-19 DIAGNOSIS — N179 Acute kidney failure, unspecified: Secondary | ICD-10-CM

## 2019-02-19 DIAGNOSIS — R945 Abnormal results of liver function studies: Secondary | ICD-10-CM

## 2019-02-19 DIAGNOSIS — I4891 Unspecified atrial fibrillation: Secondary | ICD-10-CM

## 2019-02-19 DIAGNOSIS — R2681 Unsteadiness on feet: Secondary | ICD-10-CM

## 2019-02-19 DIAGNOSIS — E119 Type 2 diabetes mellitus without complications: Secondary | ICD-10-CM

## 2019-02-19 DIAGNOSIS — R531 Weakness: Secondary | ICD-10-CM | POA: Diagnosis not present

## 2019-02-20 DIAGNOSIS — R2681 Unsteadiness on feet: Secondary | ICD-10-CM | POA: Diagnosis not present

## 2019-02-20 DIAGNOSIS — N179 Acute kidney failure, unspecified: Secondary | ICD-10-CM | POA: Diagnosis not present

## 2019-02-21 DIAGNOSIS — N179 Acute kidney failure, unspecified: Secondary | ICD-10-CM | POA: Diagnosis not present

## 2019-02-21 DIAGNOSIS — R2681 Unsteadiness on feet: Secondary | ICD-10-CM | POA: Diagnosis not present

## 2019-02-22 DIAGNOSIS — N179 Acute kidney failure, unspecified: Secondary | ICD-10-CM | POA: Diagnosis not present

## 2019-02-22 DIAGNOSIS — R2681 Unsteadiness on feet: Secondary | ICD-10-CM | POA: Diagnosis not present

## 2019-02-23 DIAGNOSIS — N179 Acute kidney failure, unspecified: Secondary | ICD-10-CM | POA: Diagnosis not present

## 2019-02-23 DIAGNOSIS — R2681 Unsteadiness on feet: Secondary | ICD-10-CM | POA: Diagnosis not present

## 2019-02-24 ENCOUNTER — Telehealth: Payer: Self-pay | Admitting: Physician Assistant

## 2019-02-24 NOTE — Telephone Encounter (Signed)
New Message           Patient's daughter Shirlean Mylar) is calling in today to see if the Virtual visit call can be done at the "[facility]. Center? Pls call to advise.

## 2019-02-24 NOTE — Telephone Encounter (Signed)
Left message, will call again later to arrange evisit.

## 2019-02-24 NOTE — Telephone Encounter (Signed)
Spoke to the patient's daughter, he was admitted to [facility] last [place] for dehydration and fluid around the heart. Please request record. Also I have called his inpatient rehab, plan is to cancel his visit tomorrow and arrange 2-3 weeks tele visit instead.

## 2019-02-24 NOTE — Telephone Encounter (Signed)
Spoke daughter , she states patient 66 in Rehab facility.  she gave the number of facility--   [phone] --- patient is in room 10 at [facility]        To see the our office can set up  viritual visit  Rn will forward to [place] - who called daughter earlier.

## 2019-02-25 ENCOUNTER — Ambulatory Visit: Payer: Medicare Other | Admitting: Physician Assistant

## 2019-02-27 ENCOUNTER — Telehealth: Payer: Self-pay

## 2019-02-27 NOTE — Telephone Encounter (Signed)
Called the patient's rehab facility to get patient scheduled for an E-visit in 2-3 weeks. Was transferred to the patient's nurse explained everything she then transferred me to the Education officer, museum. I left a detailed voice message for the Social Worker to call me back about setting this up if possible.

## 2019-03-02 ENCOUNTER — Telehealth: Payer: Self-pay

## 2019-03-02 NOTE — Telephone Encounter (Signed)
Called the facility is currently staying and left a voice message for the Social Worker, Mrs. Melanee Left to call me back to set up a tele-visit for the patient.

## 2019-03-09 NOTE — Telephone Encounter (Signed)
I called [facility], record department close, will call tomorrow at [phone] and have them fax [name] admission record to me

## 2019-03-18 ENCOUNTER — Telehealth: Payer: Self-pay | Admitting: Cardiovascular Disease

## 2019-03-18 NOTE — Telephone Encounter (Signed)
Call Daughter phone [phone] Dad is in rehab/my chart via text to daughter's phone/ virtual consent/pre reg completed

## 2019-03-18 NOTE — Telephone Encounter (Signed)
LVM for pre reg °

## 2019-03-19 ENCOUNTER — Telehealth (INDEPENDENT_AMBULATORY_CARE_PROVIDER_SITE_OTHER): Payer: Medicare Other | Admitting: Cardiovascular Disease

## 2019-03-19 VITALS — BP 118/77 | HR 85 | Ht 69.0 in | Wt 206.0 lb

## 2019-03-19 DIAGNOSIS — I452 Bifascicular block: Secondary | ICD-10-CM

## 2019-03-19 DIAGNOSIS — E119 Type 2 diabetes mellitus without complications: Secondary | ICD-10-CM | POA: Diagnosis not present

## 2019-03-19 DIAGNOSIS — E785 Hyperlipidemia, unspecified: Secondary | ICD-10-CM | POA: Diagnosis not present

## 2019-03-19 DIAGNOSIS — Z7901 Long term (current) use of anticoagulants: Secondary | ICD-10-CM

## 2019-03-19 DIAGNOSIS — I4510 Unspecified right bundle-branch block: Secondary | ICD-10-CM

## 2019-03-19 DIAGNOSIS — I10 Essential (primary) hypertension: Secondary | ICD-10-CM

## 2019-03-19 DIAGNOSIS — I480 Paroxysmal atrial fibrillation: Secondary | ICD-10-CM | POA: Diagnosis not present

## 2019-03-19 DIAGNOSIS — N183 Chronic kidney disease, stage 3 unspecified: Secondary | ICD-10-CM

## 2019-03-19 DIAGNOSIS — R600 Localized edema: Secondary | ICD-10-CM | POA: Diagnosis not present

## 2019-03-19 NOTE — Patient Instructions (Signed)
Medication Instructions:  The current medical regimen is effective;  continue present plan and medications.  If you need a refill on your cardiac medications before your next appointment, please call your pharmacy.   Follow-Up: At [facility], you and your health needs are our priority.  As part of our continuing mission to provide you with exceptional heart care, we have created designated Provider Care Teams.  These Care Teams include your primary Cardiologist (physician) and Advanced Practice Providers (APPs -  Physician Assistants and Nurse Practitioners) who all work together to provide you with the care you need, when you need it. You will need a follow up appointment in 3 months.  You may see Thomas Kelly, MD or one of the following Advanced Practice Providers on your designated Care Team: Hao Meng, PA-C . Angela Duke, PA-C    

## 2019-03-19 NOTE — Progress Notes (Signed)
Virtual Visit via Telephone Note   This visit type was conducted due to national recommendations for restrictions regarding Ethan COVID-19 Pandemic (e.g. social distancing) in an effort to limit this [name]'s exposure and mitigate transmission in our community.  Due to his co-morbid illnesses, this [name] is at least at moderate risk for complications without adequate follow up.  This format is felt to be most appropriate for this [name] at this time.  [name] did not have access to video technology/had technical difficulties with video requiring transitioning to audio format only (telephone).  All issues noted in this document were discussed and addressed.  No physical exam could be performed with this format.  Please refer to [name]'s chart for his  consent to telehealth for [facility].   Evaluation Performed:  Follow-up visit  Date:  03/19/2019   ID:  [name], DOB [birth year], MRN [id number]  [name] Location: Home Provider Location: Home  PCP:  Imagene Riches, NP  Cardiologist:  Shelva Majestic, MD  Electrophysiologist:  None   Chief Complaint: Follow-up evaluation following his recent [facility] hospitalization, he is currently at a nursing home and rehabilitation following this recent admission  History of Present Illness:    [name] is a 85 y.o. male who has documented mild CAD by catheterization in 2006 which has been treated medically.   Additional problems include hypertension, type 2 diabetes mellitus, hyperlipidemia, obesity, and  peripheral neuropathy.  He underwent knee replacement surgery in 2013 well without cardiovascular compromise. An echo Doppler study  showed an EF of 55% with mild LVH, mild LA dilatation, mild to moderate mitral annular calcification trace MR, and mild pulmonary hypertension with mild TR with estimated pressure 32 mm. There was aortic sclerosis without stenosis with mild aortic insufficiency and mild pulmonic insufficiency.   When I saw him in November 2018 he denied any  recurrent episodes of chest tightness.  He has chronic right bundle branch block.  He was unaware of palpitations.  He was on Crestor L and fish oil for hyperlipidemia, metformin for diabetes mellitus, and was taking torsemide 20 mg twice a day both for blood pressure and ankle edema in addition to Toprol-XL 50 mg and quinapril HCT 20/25 mg for hypertension control.  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  He was on Namenda for memory.   He was hospitalized at [facility] in January 2018 in [place] setting of dehydration and had experienced several falls.  He was found to be in atrial fibrillation with RVR.  He was started on Eliquis 5 mg twice a day.  His metoprolol dose was increased.  He was found to have liver steatosis.  An echo Doppler study showed an EF of 40% which was reduced from previously at 55%.  He apparently was rehospitalized with volume overload at [facility] overnight on February 27, 2018.  Was felt to have possible left lower lobe pneumonia on chest x-ray in [place] emergency room.  Creatinine was 1.4.  He was felt to have acute on chronic systolic heart failure with EF around 45% and there was evidence for fluid overload.  He was placed on high-dose Lasix along with Zaroxolyn for 1 dose and additional medications were adjusted.  He was 1.9 at admission which improved.  He had follow-up lab work yesterday done at [facility] which now shows his creatinine at 1.05.  BNP was 367.  He is breathing better since his  hospitalization.  He admits to an 11 pound weight loss.    I saw him March 07, 2018 for follow-up of his hospitalization.  He had lost 11 pounds since his hospitalization.  Continues to have 2-3+ lower extremity edema.  Renal function had improved.  I increase his Lasix to 80 mg in Ethan morning and 60 mg in Ethan afternoon.  He is feeling better.  His edema has improved.  Echo Doppler study from  March 21, 2018 showed an EF of 45 to 50%.  There was diffuse hypo-kinesis, mild MR and mild left atrial dilatation with moderate right atrial dilatation.  PA peak pressure estimate was 35 mm.    I saw him in May 2019, he was still in atrial fibrillation on Eliquis.  I started amiodarone 200 mg twice a day for 3 days and then only recommended he stay on 200 mg daily.  QTc interval is increased and I recommended he reduce his Lexapro down to 5 mg.  Blood pressure was stable and he was continuing Lasix for diuresis.  When Isaw him on May 05, 2018  he continued to have 3+ pitting edema.  At that time I recommended initiation of metolazone 2.5 mg 30 minutes prior to his morning Lasix dose and do this every other day for 3 days and then change to every third day.  I reduced his lisinopril to 2.5 mg.  Laboratory was repeated and subsequent blood work 1 week later show his his creatinine had increased to 1.52.  Scheduled him for 1 week follow-up evaluation.    When I saw him for follow-up on May 15, 2018 and he had had significant improvement in his leg swelling and was breathing better.  Laboratory by his primary physician had shown an increase in creatinine at 2.0, sodium 134, potassium 4.3.  As result, at that office visit I reduced his metolazone to 2.5 mg weekly and reduced his Lasix to 80 mg in Ethan morning and 40 mg in Ethan afternoon.  A subsequent bmet on May 19, 2018 showed improvement in his creatinine at 1.6.  He has noticed some slight increase in his pretibial edema.  His weight has been constant at 218.     In July 2019 he was in atrial fibrillation with ventricular rate in Ethan 70s and his weight had been stable around 218.  I last saw him in October 2019 at that time he was  taking metolazone every third day since when he tried going every fifth day Ethan swelling recurred.  He denies recent chest pain.  He denies bleeding on Eliquis.  He continues to be on rosuvastatin.  He had recent laboratory  done at [facility] September 11, 2018.  Creatinine was 1.49.  LFTs minimally increased with an AST at 50 and a normal ALT at 33.  His estimated GFR was 43.  He was not anemic.    Since I last saw him, his wife states that his diuretic regimen was reduced due to renal insufficiency.  On January 07, 2019 he was admitted to [facility] and was discharged on January 09, 2019.  He presented with a 7-day course of frequent falls.  There was no loss of consciousness.  He received IV fluid overnight on admission.  Orthostatic vital signs were negative.  Ultimately his dose of furosemide was reduced.  He was taken metolazone 2.5 mg every third day.  According to [name]'s daughter, that [name] gained to give again fluid retention with  reduction of his diuretic regimen to Ethan point where as of last Friday his weight had risen from 192 up to 215.  He was seen by Heide Scales- Maurie Boettcher in [place] and was advised to go to [place] hospital.  Cording to [name] started [name]'s legs were extremely red.  He was not admitted and sent home and told to take furosemide 40 mg twice a day.  Over Ethan weekend, he continues to experience significant leg swelling and redness to his legs.  Denies fevers.  He is breathing better.  He has lost from 215 pounds down to 207 over Ethan weekend.   He was seen as an add-on January 19, 2019 and had with worsening lower extremity edema that was most significant last Friday leading to a [facility] ER evaluation.  He was not admitted and sent home on increased Lasix at 40 mg twice a day and told to continue metolazone every third day.  He continues to be on Toprol-XL 100 mg, amiodarone 200 mg daily and Eliquis 2.5 mg twice a day.  ECG  confirms atrial fibrillation with ventricular rate at 90; bifascicular blockade with right bundle branch block and left anterior hemiblock.  I discussed possible readmission to [place] hospital today versus an attempt at trying to improve this as  an outpatient.  Since he is not having any chest pain, his ventricular rate is controlled, and he is not dyspneic I will try a more aggressive outpatient initial strategy.  With his significant 3+ edema presently I increased furosemide to 80 mg in Ethan morning and 40 mg in Ethan late afternoon.  For Ethan next several doses we will change his metolazone to 2.5 mg every other day starting tomorrow.  Due to concerns for possible cellulitis in light of his significant erythema to his lower extremities I initiated cephalexin 500 mg every 8 hours.  We discussed Ethan importance of leg elevation and avoidance of sodium.    When seen 1 week later by Almyra Deforest, PAC weight has decreased from 207 pounds to 202 pounds.  He was still 10 pounds over his baseline dry weight and appears to have at least 1+ pitting edema and his dose of furosemide was further increased to 80 twice a day  at that time.  Apparently, with his increased diuretic regimen, he ultimately re-presented to [facility] on March 27 and was felt to be dehydrated and had acute on chronic renal insufficiency.  His creatinine had risen to 2.2.  His medications were adjusted.  On March 30 he was discharged and since that time has been at [facility] nursing home with close monitoring.  I had called his daughter today and had spoken with her who is not been able to see him.  I also called [facility] nursing home and in addition to speaking with [name] also spoke with his current nurse.  His laboratories have improved and on April 22 BUN was 26, creatinine 1.6 and potassium 4.1.  Hemoglobin 12.5, hematocrit 37.6, white blood count 5.9.  [name] denies any chest pain or shortness of breath.  He denies bleeding.  He feels improved.  He is undergoing both occupational as well as physical therapy with plans for subsequent discharge home once stable.  [name] does not have symptoms concerning for COVID-19 infection (fever, chills, cough, or new shortness of breath).     Past Medical History:  Diagnosis Date  . Arthritis   . Cancer ([place])    skin cancer,melonoma on nose  .  Diabetes mellitus   . GERD (gastroesophageal reflux disease)   . Hyperlipidemia   . Hypertension   . Myocardial infarction ([place])    1990  . Neuromuscular disorder ([place])    neuropathy   Past Surgical History:  Procedure Laterality Date  . BACK SURGERY    . CERVICAL LAMINECTOMY    . JOINT REPLACEMENT     right knee  . PROSTATE SURGERY    . ROTATOR CUFF REPAIR    . SKIN CANCER EXCISION     eyelid and melonoma on nose  . TOTAL KNEE ARTHROPLASTY  09/08/2012   Procedure: TOTAL KNEE ARTHROPLASTY;  Surgeon: Rudean Haskell, MD;  Location: [facility];  Service: Orthopedics;  Laterality: Left;  left total knee arthroplasty     Current Meds  Medication Sig  . allopurinol (ZYLOPRIM) 300 MG tablet Take 1 tablet by mouth daily.  Marland Kitchen amiodarone (PACERONE) 200 MG tablet Take 1 tablet (200 mg total) by mouth daily.  Marland Kitchen antiseptic oral rinse (BIOTENE) LIQD 15 mLs by Mouth Rinse route 2 (two) times daily.  Marland Kitchen apixaban (ELIQUIS) 2.5 MG TABS tablet Take 1 tablet (2.5 mg total) by mouth 2 (two) times daily.  Marland Kitchen escitalopram (LEXAPRO) 10 MG tablet Take 10 mg by mouth daily.  . furosemide (LASIX) 40 MG tablet Take 40 mg by mouth daily.  . Gabapentin Enacarbil (HORIZANT) 600 MG TBCR Take 600 mg by mouth 2 (two) times daily.   . Magnesium Oxide 400 MG CAPS Take 400 mg by mouth daily.  . metFORMIN (GLUCOPHAGE) 1000 MG tablet Take 1,000 mg by mouth 2 (two) times daily with a meal.  . metoprolol succinate (TOPROL-XL) 100 MG 24 hr tablet TAKE 1 TABLET BY MOUTH DAILY  . NAMENDA XR 28 MG CP24 24 hr capsule Take 28 mg by mouth daily.   Marland Kitchen omeprazole (PRILOSEC) 20 MG capsule Take 20 mg by mouth daily.  Marland Kitchen oxycodone (OXY-IR) 5 MG capsule Take 5 mg by mouth 2 (two) times daily.  . protein supplement (PROSOURCE NO CARB) LIQD Take 30 mLs by mouth daily.  . rosuvastatin (CRESTOR) 5 MG tablet Take 5 mg by mouth daily.   Marland Kitchen testosterone cypionate (DEPOTESTOSTERONE CYPIONATE) 200 MG/ML injection Inject 200 mg into Ethan muscle every 14 (fourteen) days.     Allergies:   [name] has no known allergies.   Social History   Tobacco Use  . Smoking status: Former Smoker    Packs/day: 1.00    Years: 12.00    Pack years: 12.00    Types: Cigarettes  . Smokeless tobacco: Former Systems developer    Types: Chew  Substance Use Topics  . Alcohol use: No  . Drug use: No     Family Hx: [name]'s family history is not on file.  ROS:   Please see Ethan history of present illness.    Feels improved No significant dyspnea Pulse irregular but rate controlled No chest pain No bleeding on anticoagulation Leg swelling present but significantly improved; Resolution of prior lower extremity erythema following antibiotics No presyncope or syncope Diabetes mellitus Hyperlipidemia All other systems reviewed and are negative.   Prior CV studies:   Ethan following studies were reviewed today:  I reviewed Ethan laboratory data from his [facility] hospitalization as well as his nursing home data.  Labs/Other Tests and Data Reviewed:    EKG:  An ECG dated 01/19/2019 was personally reviewed today and demonstrated:  Atrial fibrillation with bifascicular block (right bundle branch block and left anterior hemiblock).  Recent  Labs: 01/07/2019: ALT 26; Hemoglobin 12.4; Platelets 124 01/09/2019: BUN 19; Creatinine, Ser 1.39; Magnesium 2.2; Potassium 3.8; Sodium 136   Recent Lipid Panel No results found for: CHOL, TRIG, HDL, CHOLHDL, LDLCALC, LDLDIRECT  Wt Readings from Last 3 Encounters:  03/19/19 206 lb (93.4 kg)  01/26/19 202 lb (91.6 kg)  01/19/19 207 lb (93.9 kg)     Objective:    Vital Signs:  BP 118/77   Pulse 85   Ht 5\' 9"  (1.753 m)   Wt 206 lb (93.4 kg)   BMI 30.42 kg/m    According to [name]'s nurse today, his heart rate is in Ethan 80s and continues to appear irregular to palpation.  Blood pressure has been stable.   His weight has been stable [name] denies any major change and actually feels significantly improved from when last seen by me. Respirations appeared normal and not labored I did not hear any audible wheezing There was no chest discomfort His abdomen was nontender His peripheral swelling has significantly improved Reportedly there were no apparent new neurologic deficits, But he has a history of peripheral neuropathy His affect was normal  ASSESSMENT & PLAN:    1. CAD: Currently he is not having any anginal symptomatology. 2. Essential hypertension: Blood pressure at [facility] nursing home is stable on his current regimen 3. Peripheral edema: Currently improved, now 1+ reported by Ethan nurse 4. PAF: When I saw [name] last in Ethan office, he was in atrial fibrillation which had recurred since October 2019; according to his nurse his pulse is still irregular but Ethan rate is controlled on metoprolol succinate 100 mg in addition to his amiodarone.  Since an ECG is not been obtained cannot verify. 5. Anticoagulation: Currently back on Eliquis 2.5 mg twice a day.  No obvious bleeding. 6. Diabetes mellitus: On metformin 7. Hyperlipidemia with target LDL less than 70: Currently on rosuvastatin 8. Renal insufficiency: Stage III currently, had developed acute kidney injury with over diuresis with creatinine increasing to 2.5 and BUN 67 on February 19, 2020 admitted to [facility].  Most recent creatinine improved to 1.61 with a BUN of 26.5.  COVID-19 Education: Ethan signs and symptoms of COVID-19 were discussed with [name] and how to seek care for testing (follow up with PCP or arrange E-visit).   Ethan importance of social distancing was discussed today.  Time:   Today, I have spent 35 minutes talking with [name]'s daughter with a separate call, Ethan nurse at Ethan nursing facility, and [name] with telehealth technology discussing Ethan above problems.     Medication Adjustments/Labs  and Tests Ordered: Current medicines are reviewed at length with [name] today.  Concerns regarding medicines are outlined above.   Tests Ordered: No orders of Ethan defined types were placed in this encounter.   Medication Changes: No orders of Ethan defined types were placed in this encounter.   Disposition:  Follow up 3 months  Signed, Shelva Majestic, MD  03/19/2019 9:21 AM    [place]

## 2019-04-27 DEATH — deceased

## 2019-06-16 ENCOUNTER — Telehealth: Payer: Self-pay | Admitting: Cardiovascular Disease

## 2019-06-16 NOTE — Telephone Encounter (Signed)
Pt expired on 03/29/2019.

## 2019-06-17 ENCOUNTER — Ambulatory Visit: Payer: Medicare Other | Admitting: Cardiovascular Disease
